# Patient Record
Sex: Male | Born: 1960 | Race: White | Hispanic: No | Marital: Married | Smoking: Former smoker
Health system: Southern US, Community
[De-identification: ages and names within clinical notes are randomized; demographics above are authoritative.]

## PROBLEM LIST (undated history)

## (undated) DIAGNOSIS — F32A Depression, unspecified: Secondary | ICD-10-CM

## (undated) DIAGNOSIS — M549 Dorsalgia, unspecified: Secondary | ICD-10-CM

## (undated) DIAGNOSIS — I10 Essential (primary) hypertension: Secondary | ICD-10-CM

## (undated) DIAGNOSIS — M5136 Other intervertebral disc degeneration, lumbar region: Secondary | ICD-10-CM

## (undated) DIAGNOSIS — F329 Major depressive disorder, single episode, unspecified: Secondary | ICD-10-CM

## (undated) DIAGNOSIS — E785 Hyperlipidemia, unspecified: Secondary | ICD-10-CM

## (undated) DIAGNOSIS — F209 Schizophrenia, unspecified: Secondary | ICD-10-CM

## (undated) DIAGNOSIS — G4730 Sleep apnea, unspecified: Secondary | ICD-10-CM

## (undated) DIAGNOSIS — N200 Calculus of kidney: Secondary | ICD-10-CM

## (undated) DIAGNOSIS — G8929 Other chronic pain: Secondary | ICD-10-CM

## (undated) DIAGNOSIS — M51369 Other intervertebral disc degeneration, lumbar region without mention of lumbar back pain or lower extremity pain: Secondary | ICD-10-CM

## (undated) HISTORY — PX: COLONOSCOPY: SHX174

---

## 2003-07-27 HISTORY — PX: CARDIAC CATHETERIZATION: SHX172

## 2004-04-09 ENCOUNTER — Inpatient Hospital Stay (HOSPITAL_BASED_OUTPATIENT_CLINIC_OR_DEPARTMENT_OTHER): Admission: RE | Admit: 2004-04-09 | Discharge: 2004-04-09 | Payer: Self-pay | Admitting: Cardiology

## 2007-10-17 ENCOUNTER — Ambulatory Visit: Payer: Self-pay | Admitting: Cardiology

## 2007-11-27 ENCOUNTER — Ambulatory Visit: Payer: Self-pay | Admitting: Cardiology

## 2008-01-24 ENCOUNTER — Ambulatory Visit: Payer: Self-pay | Admitting: Cardiology

## 2008-03-12 ENCOUNTER — Ambulatory Visit: Payer: Self-pay | Admitting: Cardiology

## 2009-04-09 ENCOUNTER — Ambulatory Visit: Payer: Self-pay | Admitting: Cardiology

## 2009-04-22 DIAGNOSIS — E782 Mixed hyperlipidemia: Secondary | ICD-10-CM | POA: Insufficient documentation

## 2009-04-22 DIAGNOSIS — E785 Hyperlipidemia, unspecified: Secondary | ICD-10-CM

## 2009-04-22 DIAGNOSIS — R079 Chest pain, unspecified: Secondary | ICD-10-CM | POA: Insufficient documentation

## 2009-04-22 DIAGNOSIS — E663 Overweight: Secondary | ICD-10-CM | POA: Insufficient documentation

## 2010-12-08 NOTE — Assessment & Plan Note (Signed)
[facility]                          [facility]   NAME:Williams Williams HELLMER                         MRN:          [id number]  DATE:03/12/2008                            DOB:          03/01/1961    Williams Williams is seen for cardiology followup.  He is known to our group  having had a chest pain in the past with normal coronaries in 2005.  He  was hospitalized in July 2009.  At that time, he had a stress echo.  His  ejection fraction was 50 to 55%.  There was no chest pain.  EKG changes  were equivocal.  There was no evidence of stress-induced left  ventricular dysfunction.  It was noted that the patient has a history of  severe right ventricular dysfunction.  Recommendation has been to  proceed with a cardiac MRI in the past.  I discussed this with him  today, and he would like to proceed.  We will look into making the  arrangements.   Recently, he has not had chest pain.  He has had some discomfort in his  legs intermittently.   ALLERGIES:  Rash from CODEINE.   MEDICATIONS:  1. Depakote 500.  2. Clonazepam 0.5.  3. Simvastatin 20.  4. Amitriptyline 25.  5. Lisinopril 10.  6. Atenolol 25.  7. Metformin 500 in the morning and 250 in the evening.  8. Lexapro 10.  9. Fish oil.   OTHER MEDICAL PROBLEMS:  See the list below.   REVIEW OF SYSTEMS:  Other than the HPI, his review of systems today is  negative.   PHYSICAL EXAMINATION:  VITAL SIGNS:  Weight is 320 pounds.  Blood  pressure is 124/85 with pulse of 80.  GENERAL:  The patient is oriented to person, time, and place.  Affect is  normal.  HEENT:  Reveals no xanthelasma.  He has normal extraocular motion.  NECK:  There are no carotid bruits.  There is no jugular venous  distention.  LUNGS:  Clear.  Respiratory effort is nonlabored.  CARDIAC:  Reveals an S1 and S2.  There are no clicks or significant  murmurs.  ABDOMEN:  Obese but soft.  EXTREMITIES:  He has no peripheral  edema.   PROBLEMS:  1. History of chest discomfort, stable recently.  Normal coronary      arteries by cath in 2005.  2. Hyperlipidemia.  3. Family history of coronary artery disease.  4. Depression.  5. Obesity.  6. Probable obstructive sleep apnea.  7. History of severe right ventricular dysfunction by echo with a need      for MRI.  8. History of polysubstance abuse.  9. History of schizophrenia.  10.History of depression.   Williams Williams cardiac status is stable today.  We will proceed with a  cardiac MRI to look carefully at the reason for his severe right  ventricular dysfunction.  This had been recommended to him in the past.  We will make sure Dr. Eden Emms is aware.   ADDENDUM:  We had been planning to try to proceed with a  cardiac MRI to  assess the patient's right ventricular dysfunction.  He weighs 320  pounds.  Unfortunately, the maximum for the magnet is 300 pounds.  We  will be in touch with him and encouraged him to begin to try to lose  some weight and then we will see him for cardiology followup to reassess  the situation.     Xavier Abed, MD, [facility]  Electronically Signed    JDK/MedQ  DD: 03/12/2008  DT: 03/13/2008  Job #: [id number]   cc:   Ernestine Conrad, MD

## 2010-12-08 NOTE — Assessment & Plan Note (Signed)
[facility]                          [facility]   NAME:Xavier Williams, Xavier Williams                         MRN:          [id number]  DATE:03/12/2008                            DOB:          Aug 23, 1960    ADDENDUM   We had been planning to try to proceed with a cardiac MRI to assess the  patient's right ventricular dysfunction.  He weighs 320 pounds.  Unfortunately, the maximum for the pregnant is 300 pounds.  We will be  in touch with him and encouraged him to begin to try to lose some weight  and then we will see him for cardiology followup to reassess the  situation.     Luis Abed, MD, [facility]     JDK/MedQ  DD: 03/12/2008  DT: 03/13/2008  Job #: [id number]

## 2010-12-11 NOTE — Cardiovascular Report (Signed)
NAME:  Xavier Williams, Xavier Williams                            ACCOUNT NO.:  [id number]   MEDICAL RECORD NO.:  [id number]                   PATIENT TYPE:  OIB   LOCATION:  6501                                 FACILITY:  [facility]   PHYSICIAN:  Rollene Rotunda, M.D.                DATE OF BIRTH:  May 03, 1961   DATE OF PROCEDURE:  04/09/2004  DATE OF DISCHARGE:                              CARDIAC CATHETERIZATION   PRIMARY CARE PHYSICIAN:  Dr. Lia Hopping.   PROCEDURE:  Left heart catheterization, coronary arteriography.   INDICATION:  Evaluate patient with chest pain in a submaximal exercise  treadmill test.   PROCEDURE NOTE:  Left heart catheterization was performed via the right  femoral artery.  The artery was cannulated using an anterior wall puncture.  A 4 French arterial sheath was inserted via the modified Seldinger  technique.  Preformed Judkins and a pigtail catheter were utilized.  The  patient tolerated the procedure well and left the lab in stable condition.   RESULTS:   HEMODYNAMICS:  1.  LV 142/18.  2.  Aortic 140/80.   CORONARIES:  The left main was normal.  The LAD was normal.  The small  diagonal was normal.  Circumflex was large and dominant and was normal in  the AV groove.  An OM-1 was moderate to large, nonbranching and normal.  An  OM-2 was very large and branching.  There was a first posterior lateral  which was large to moderate size and normal.  A second posterior lateral was  moderate size and normal.  The right coronary artery was nondominant and  normal.   LEFT VENTRICULOGRAM:  Left ventriculogram was obtained in the RAO  projection.  The EF was 55% with no regional wall motion abnormalities.   CONCLUSION:  1.  Normal coronaries.  2.  Low normal left ventricular ejection fraction with no regional wall      motion abnormalities.   PLAN:  No further cardiac workup is planned.  The patient will continue with  evaluation of probable nonanginal chest discomfort.                                               Rollene Rotunda, M.D.    JH/MEDQ  D:  04/09/2004  T:  04/09/2004  Job:  [id number]   cc:   Annette Stable Hasanaj  [address]  [place]  [place] [address]  Fax: [phone]   [facility] [place]

## 2011-02-19 ENCOUNTER — Emergency Department (HOSPITAL_COMMUNITY)
Admission: EM | Admit: 2011-02-19 | Discharge: 2011-02-20 | Disposition: A | Discharge status: Home / Self Care | Payer: Medicare HMO | Attending: Emergency Medicine | Admitting: Emergency Medicine

## 2011-02-19 ENCOUNTER — Other Ambulatory Visit: Payer: Self-pay

## 2011-02-19 ENCOUNTER — Encounter: Payer: Self-pay | Admitting: *Deleted

## 2011-02-19 DIAGNOSIS — R079 Chest pain, unspecified: Secondary | ICD-10-CM | POA: Insufficient documentation

## 2011-02-19 DIAGNOSIS — E119 Type 2 diabetes mellitus without complications: Secondary | ICD-10-CM | POA: Insufficient documentation

## 2011-02-19 DIAGNOSIS — Z87891 Personal history of nicotine dependence: Secondary | ICD-10-CM | POA: Insufficient documentation

## 2011-02-19 DIAGNOSIS — I10 Essential (primary) hypertension: Secondary | ICD-10-CM | POA: Insufficient documentation

## 2011-02-19 HISTORY — DX: Sleep apnea, unspecified: G47.30

## 2011-02-19 HISTORY — DX: Essential (primary) hypertension: I10

## 2011-02-19 NOTE — ED Provider Notes (Signed)
History     Chief Complaint  Patient presents with  . Chest Pain   HPI Comments: Patient was sitting on his porch at 830-900 last night and had a sharp stabbing pain to the left chest. It lasted only seconds. He got up and checked his sugar which was 94. Drank some sweet tea. Pain resolved and has not recurred. Son encouraged him to come to the ER and be checked out.Denies fever, chills, cough, shortness of breath.  Patient is a 50 y.o. male presenting with chest pain. The history is provided by the patient.  Chest Pain The chest pain began 3 - 5 hours ago. The chest pain is resolved. At its most intense, the pain is at 6/10. The pain is currently at 0/10. The severity of the pain is moderate. The quality of the pain is described as sharp and stabbing. Exacerbated by: nothing. Pertinent negatives for primary symptoms include no fever, no cough, no wheezing, no palpitations, no nausea, no vomiting and no dizziness.  Procedure history is positive for cardiac catheterization and echocardiogram.     Past Medical History  Diagnosis Date  . Diabetes mellitus   . Hypertension   . Sleep apnea     Past Surgical History  Procedure Date  . Cardiac catheterization   . Colonoscopy     History reviewed. No pertinent family history.  History  Substance Use Topics  . Smoking status: Former Games developer  . Smokeless tobacco: Not on file  . Alcohol Use: No      Review of Systems  Constitutional: Negative for fever.  Respiratory: Negative for cough and wheezing.   Cardiovascular: Positive for chest pain. Negative for palpitations.  Gastrointestinal: Negative for nausea and vomiting.  Neurological: Negative for dizziness.  All other systems reviewed and are negative.    Physical Exam  BP 137/91  Pulse 65  Temp(Src) 98.4 F (36.9 C) (Oral)  Ht 6' (1.829 m)  Wt 330 lb (149.687 kg)  BMI 44.76 kg/m2  SpO2 97%  Physical Exam  Nursing note and vitals reviewed. Constitutional: He is  oriented to person, place, and time. He appears well-developed and well-nourished.  HENT:  Head: Normocephalic.  Right Ear: External ear normal.  Left Ear: External ear normal.  Nose: Nose normal.  Mouth/Throat: Oropharynx is clear and moist.  Eyes: EOM are normal. Pupils are equal, round, and reactive to light.  Neck: Normal range of motion. Neck supple.  Cardiovascular: Normal rate, normal heart sounds and intact distal pulses.   Pulmonary/Chest: Effort normal and breath sounds normal.  Abdominal: Soft. Bowel sounds are normal.  Musculoskeletal: Normal range of motion.  Neurological: He is alert and oriented to person, place, and time.  Skin: Skin is warm and dry.    ED Course  Procedures  MDM Reviewed labs, radiology reports, nurse notes, vital signs. Reviewed results with patient. He has been painfree since arrival in the ER      [facility]. Colon Branch, MD 02/20/11 1610

## 2011-02-19 NOTE — ED Notes (Signed)
Pt c/o sharp chest pain to left side of chest with radiating pain to left arm and down left leg; pt states the pain started today and has had high blood pressure with it today

## 2011-02-20 ENCOUNTER — Emergency Department (HOSPITAL_COMMUNITY): Payer: Medicare HMO

## 2011-02-20 LAB — POCT I-STAT, CHEM 8
Creatinine, Ser: 1.1 mg/dL (ref 0.50–1.35)
HCT: 41 % (ref 39.0–52.0)
Hemoglobin: 13.9 g/dL (ref 13.0–17.0)
Potassium: 3.4 mEq/L — ABNORMAL LOW (ref 3.5–5.1)
Sodium: 139 mEq/L (ref 135–145)
TCO2: 20 mmol/L (ref 0–100)

## 2011-02-20 LAB — CARDIAC PANEL(CRET KIN+CKTOT+MB+TROPI)
CK, MB: 2.4 ng/mL (ref 0.3–4.0)
Relative Index: 2.1 (ref 0.0–2.5)
Total CK: 117 U/L (ref 7–232)

## 2011-02-20 NOTE — ED Notes (Signed)
Pt reports one brief sharp pain in chest earlier in the day.  Reports now he has some ache and tightness in left arm, and left leg.  Grasp equal bilat.  Good strength.  Pt does report history of  heart failure, and cardiac stents. Denies any current chest pain or SOB.  Very mild edema noted in lower extremities.  Pt reports compliance with medications.

## 2013-02-09 DIAGNOSIS — R079 Chest pain, unspecified: Secondary | ICD-10-CM

## 2013-03-11 ENCOUNTER — Emergency Department (HOSPITAL_COMMUNITY): Payer: Medicare Other

## 2013-03-11 ENCOUNTER — Emergency Department (HOSPITAL_COMMUNITY)
Admission: EM | Admit: 2013-03-11 | Discharge: 2013-03-11 | Disposition: A | Discharge status: Home / Self Care | Payer: Medicare Other | Attending: Emergency Medicine | Admitting: Emergency Medicine

## 2013-03-11 ENCOUNTER — Encounter (HOSPITAL_COMMUNITY): Payer: Self-pay | Admitting: Emergency Medicine

## 2013-03-11 DIAGNOSIS — W2209XA Striking against other stationary object, initial encounter: Secondary | ICD-10-CM | POA: Insufficient documentation

## 2013-03-11 DIAGNOSIS — F3289 Other specified depressive episodes: Secondary | ICD-10-CM | POA: Insufficient documentation

## 2013-03-11 DIAGNOSIS — Z8659 Personal history of other mental and behavioral disorders: Secondary | ICD-10-CM | POA: Insufficient documentation

## 2013-03-11 DIAGNOSIS — Z8739 Personal history of other diseases of the musculoskeletal system and connective tissue: Secondary | ICD-10-CM | POA: Insufficient documentation

## 2013-03-11 DIAGNOSIS — R319 Hematuria, unspecified: Secondary | ICD-10-CM | POA: Insufficient documentation

## 2013-03-11 DIAGNOSIS — S0001XA Abrasion of scalp, initial encounter: Secondary | ICD-10-CM

## 2013-03-11 DIAGNOSIS — M545 Low back pain, unspecified: Secondary | ICD-10-CM | POA: Insufficient documentation

## 2013-03-11 DIAGNOSIS — F329 Major depressive disorder, single episode, unspecified: Secondary | ICD-10-CM | POA: Insufficient documentation

## 2013-03-11 DIAGNOSIS — Y929 Unspecified place or not applicable: Secondary | ICD-10-CM | POA: Insufficient documentation

## 2013-03-11 DIAGNOSIS — I10 Essential (primary) hypertension: Secondary | ICD-10-CM | POA: Insufficient documentation

## 2013-03-11 DIAGNOSIS — G8929 Other chronic pain: Secondary | ICD-10-CM

## 2013-03-11 DIAGNOSIS — E119 Type 2 diabetes mellitus without complications: Secondary | ICD-10-CM | POA: Insufficient documentation

## 2013-03-11 DIAGNOSIS — Z79899 Other long term (current) drug therapy: Secondary | ICD-10-CM | POA: Insufficient documentation

## 2013-03-11 DIAGNOSIS — Z87891 Personal history of nicotine dependence: Secondary | ICD-10-CM | POA: Insufficient documentation

## 2013-03-11 DIAGNOSIS — IMO0002 Reserved for concepts with insufficient information to code with codable children: Secondary | ICD-10-CM | POA: Insufficient documentation

## 2013-03-11 DIAGNOSIS — Y939 Activity, unspecified: Secondary | ICD-10-CM | POA: Insufficient documentation

## 2013-03-11 DIAGNOSIS — M549 Dorsalgia, unspecified: Secondary | ICD-10-CM

## 2013-03-11 DIAGNOSIS — Z23 Encounter for immunization: Secondary | ICD-10-CM | POA: Insufficient documentation

## 2013-03-11 DIAGNOSIS — Z7982 Long term (current) use of aspirin: Secondary | ICD-10-CM | POA: Insufficient documentation

## 2013-03-11 DIAGNOSIS — E785 Hyperlipidemia, unspecified: Secondary | ICD-10-CM | POA: Insufficient documentation

## 2013-03-11 HISTORY — DX: Hyperlipidemia, unspecified: E78.5

## 2013-03-11 HISTORY — DX: Other chronic pain: G89.29

## 2013-03-11 HISTORY — DX: Other intervertebral disc degeneration, lumbar region: M51.36

## 2013-03-11 HISTORY — DX: Schizophrenia, unspecified: F20.9

## 2013-03-11 HISTORY — DX: Dorsalgia, unspecified: M54.9

## 2013-03-11 HISTORY — DX: Depression, unspecified: F32.A

## 2013-03-11 HISTORY — DX: Major depressive disorder, single episode, unspecified: F32.9

## 2013-03-11 HISTORY — DX: Other intervertebral disc degeneration, lumbar region without mention of lumbar back pain or lower extremity pain: M51.369

## 2013-03-11 LAB — URINALYSIS, ROUTINE W REFLEX MICROSCOPIC
Leukocytes, UA: NEGATIVE
Nitrite: NEGATIVE
Protein, ur: NEGATIVE mg/dL
Specific Gravity, Urine: >1.03 — ABNORMAL HIGH (ref 1.005–1.030)
Urobilinogen, UA: 0.2 mg/dL (ref 0.0–1.0)

## 2013-03-11 LAB — URINE MICROSCOPIC-ADD ON

## 2013-03-11 MED ORDER — METHOCARBAMOL 500 MG PO TABS: 1000.0000 mg | ORAL_TABLET | Freq: Four times a day (QID) | ORAL | Status: DC | PRN

## 2013-03-11 MED ORDER — TETANUS-DIPHTH-ACELL PERTUSSIS 5-2.5-18.5 LF-MCG/0.5 IM SUSP
0.5000 mL | Freq: Once | INTRAMUSCULAR | Status: AC
  Administered 2013-03-11: 0.5 mL via INTRAMUSCULAR
  Filled 2013-03-11: qty 0.5

## 2013-03-11 MED ORDER — OXYCODONE-ACETAMINOPHEN 5-325 MG PO TABS
2.0000 | ORAL_TABLET | Freq: Once | ORAL | Status: AC
  Administered 2013-03-11: 2 via ORAL
  Filled 2013-03-11: qty 2

## 2013-03-11 NOTE — ED Provider Notes (Signed)
CSN: [id number]     Arrival date & time 03/11/13  1704 History     First MD Initiated Contact with Patient 03/11/13 1723     Chief Complaint  Patient presents with  . Hematuria  . Back Pain   HPI Pt was seen at 1730. Per pt, c/o gradual onset and persistence of constant acute flair of his chronic left sided low back "pain" for the past 1 year, worse over the past several days.  Denies any change in his usual chronic pain pattern.  Pain worsens with palpation of the area and body position changes. States has had "dark urine" today, and did not know if that was related to his back pain.  Denies dysuria, no testicular pain/swelling, no flank pain. Denies incont/retention of bowel or bladder, no saddle anesthesia, no focal motor weakness, no tingling/numbness in extremities, no fevers, no low back injury, no abd pain, no CP/SOB. Pt also c/o sudden onset and resolution of one episode of scalp injury that occurred today. States he hit the top of his head on a car today and "cut myself." Denies LOC, no AMS, no neck pain, no vusial changes.        Past Medical History  Diagnosis Date  . Diabetes mellitus   . Hypertension   . Sleep apnea   . DDD (degenerative disc disease), lumbar   . Depression   . Schizophrenia   . Hyperlipidemia   . Chronic back pain    Past Surgical History  Procedure Laterality Date  . Colonoscopy    . Cardiac catheterization  2005    normal coronary arteries    History  Substance Use Topics  . Smoking status: Former Games developer  . Smokeless tobacco: Not on file  . Alcohol Use: No    Review of Systems ROS: Statement: All systems negative except as marked or noted in the HPI; Constitutional: Negative for fever and chills. ; ; Eyes: Negative for eye pain, redness and discharge. ; ; ENMT: Negative for ear pain, hoarseness, nasal congestion, sinus pressure and sore throat. ; ; Cardiovascular: Negative for chest pain, palpitations, diaphoresis, dyspnea and peripheral  edema. ; ; Respiratory: Negative for cough, wheezing and stridor. ; ; Gastrointestinal: Negative for nausea, vomiting, diarrhea, abdominal pain, blood in stool, hematemesis, jaundice and rectal bleeding. . ; ; Genitourinary: +"dark urine." Negative for dysuria, flank pain and hematuria. ; ; Genital:  No penile drainage or rash, no testicular pain or swelling, no scrotal rash or swelling.;; Musculoskeletal: +LBP. Negative for neck pain. Negative for swelling and trauma.; ; Skin: +abrasion. Negative for pruritus, rash, blisters, bruising and skin lesion.; ; Neuro: Negative for headache, lightheadedness and neck stiffness. Negative for weakness, altered level of consciousness , altered mental status, extremity weakness, paresthesias, involuntary movement, seizure and syncope.       Allergies  Codeine  Home Medications   Current Outpatient Rx  Name  Route  Sig  Dispense  Refill  . aspirin 81 MG tablet   Oral   Take 81 mg by mouth every morning.          Marland Kitchen aspirin-acetaminophen-caffeine (EXCEDRIN MIGRAINE) 250-250-65 MG per tablet   Oral   Take 2 tablets by mouth daily as needed for pain.         . Aspirin-Acetaminophen-Caffeine (GOODY HEADACHE PO)   Oral   Take 1 packet by mouth daily as needed (FOR PAIN).         Marland Kitchen atenolol (TENORMIN) 25 MG tablet   Oral  Take 25 mg by mouth daily.           Marland Kitchen buPROPion (WELLBUTRIN XL) 150 MG 24 hr tablet   Oral   Take 150 mg by mouth every morning.         . cloNIDine (CATAPRES) 0.1 MG tablet   Oral   Take 0.2 mg by mouth at bedtime.           . diazepam (VALIUM) 10 MG tablet   Oral   Take 10 mg by mouth 2 (two) times daily.         . fish oil-omega-3 fatty acids 1000 MG capsule   Oral   Take 1 g by mouth 2 (two) times daily.         . fluticasone (FLONASE) 50 MCG/ACT nasal spray   Nasal   Place 2 sprays into the nose as needed.           Marland Kitchen glimepiride (AMARYL) 2 MG tablet   Oral   Take 1 mg by mouth every morning.           . hydrochlorothiazide 25 MG tablet   Oral   Take 12.5 mg by mouth every morning.          Marland Kitchen lisinopril (PRINIVIL,ZESTRIL) 10 MG tablet   Oral   Take 5 mg by mouth every morning.         . metFORMIN (GLUCOPHAGE) 1000 MG tablet   Oral   Take 1,000 mg by mouth 2 (two) times daily with a meal.           . pantoprazole (PROTONIX) 40 MG tablet   Oral   Take 40 mg by mouth every morning.         . sertraline (ZOLOFT) 50 MG tablet   Oral   Take 50 mg by mouth every morning.         . simvastatin (ZOCOR) 20 MG tablet   Oral   Take 20 mg by mouth at bedtime.           . nitroGLYCERIN (NITROSTAT) 0.4 MG SL tablet   Sublingual   Place 0.4 mg under the tongue every 5 (five) minutes as needed.            BP 143/84  Pulse 72  Temp(Src) 98.4 F (36.9 C) (Oral)  Resp 20  Ht 6' (1.829 m)  Wt 295 lb (133.811 kg)  BMI 40 kg/m2  SpO2 98% Physical Exam 1735: Physical examination:  Nursing notes reviewed; Vital signs and O2 SAT reviewed;  Constitutional: Well developed, Well nourished, Well hydrated, In no acute distress; Head:  Normocephalic, +approx 1.5cm linear superficial hemostatic abrasion to scalp at top of head.; Eyes: EOMI, PERRL, No scleral icterus; ENMT: Mouth and pharynx normal, Mucous membranes moist; Neck: Supple, Full range of motion, No lymphadenopathy; Cardiovascular: Regular rate and rhythm, No gallop; Respiratory: Breath sounds clear & equal bilaterally, No rales, rhonchi, wheezes.  Speaking full sentences with ease, Normal respiratory effort/excursion; Chest: Nontender, Movement normal; Abdomen: Soft, Nontender, Nondistended, Normal bowel sounds; Genitourinary: No CVA tenderness; Spine:  No midline CS, TS, LS tenderness. +TTP left lumbar paraspinal muscles. No rash.;; Extremities: Pulses normal, No tenderness, No edema, No calf edema or asymmetry.; Neuro: AA&Ox3, Major CN grossly intact.  Speech clear. Strength 5/5 equal bilat UE's and LE's, including  great toe dorsiflexion.  DTR 2/4 equal bilat UE's and LE's.  No gross sensory deficits.  Neg straight leg raises bilat. Climbs on and off stretcher  easily by himself. Gait steady.;; Skin: Color normal, Warm, Dry.   ED Course   Procedures   MDM  MDM Reviewed: previous chart, nursing note and vitals Interpretation: CT scan and labs   Results for orders placed during the hospital encounter of 03/11/13  URINALYSIS, ROUTINE W REFLEX MICROSCOPIC      Result Value Range   Color, Urine YELLOW  YELLOW   APPearance CLEAR  CLEAR   Specific Gravity, Urine >1.030 (*) 1.005 - 1.030   pH 5.5  5.0 - 8.0   Glucose, UA NEGATIVE  NEGATIVE mg/dL   Hgb urine dipstick TRACE (*) NEGATIVE   Bilirubin Urine NEGATIVE  NEGATIVE   Ketones, ur NEGATIVE  NEGATIVE mg/dL   Protein, ur NEGATIVE  NEGATIVE mg/dL   Urobilinogen, UA 0.2  0.0 - 1.0 mg/dL   Nitrite NEGATIVE  NEGATIVE   Leukocytes, UA NEGATIVE  NEGATIVE  URINE MICROSCOPIC-ADD ON      Result Value Range   RBC / HPF 3-6  <3 RBC/hpf   Ct Abdomen Pelvis Wo Contrast 03/11/2013   *RADIOLOGY REPORT*  Clinical Data: Hematuria and back pain.  CT ABDOMEN AND PELVIS WITHOUT CONTRAST  Technique:  Multidetector CT imaging of the abdomen and pelvis was performed following the standard protocol without intravenous contrast.  Comparison: None.  Findings: Lung Bases: There is a tiny calcified granuloma in the anterior left lower lobe.  Liver:  Unenhanced CT was performed per clinician order.  Lack of IV contrast limits sensitivity and specificity, especially for evaluation of abdominal/pelvic solid viscera.  Mild hepatosteatosis.  Spleen:  Normal.  Gallbladder:  Normal.  Common bile duct:  Normal.  Pancreas:  Normal.  Adrenal glands:  Normal bilaterally.  Kidneys:  Punctate nonobstructing left upper pole renal collecting system calculus.  Small hyperdense area in the right inferior renal pole is nonspecific but probably represents either milk of calcium in a cyst or a small  stone in a calyceal diverticulum.  The both ureters appear within normal limits.  Stomach:  Normal.  Small bowel:  Normal.  No obstruction or mesenteric adenopathy.  No free air.  Colon:   Normal appendix.  Colon appears within normal limits.  Pelvic Genitourinary:  Normal prostate.  Partially decompressed urinary bladder.  No free fluid.  Bones:  No aggressive osseous lesions.  Lumbar spondylosis. Chronic appearing T12 compression fracture with superior endplate Schmorl's node.  Vasculature: Mild atherosclerosis.  Body Wall: Tiny fat containing periumbilical hernia.  IMPRESSION:  1.  No ureteral stones or hydronephrosis.  Punctate left upper pole renal collecting system calculus.  Left upper pole 14 mm renal cyst. 2.  Calcifications in the right inferior kidney probably represent milk of calcium in a cyst or stones within the calyceal diverticulum. 3.  Mild hepatosteatosis.   Original Report Authenticated By: Andreas Newport, M.D.    1930:  Pt has tol PO well while in the ED without N/V. Abd remains benign, VSS. Feels better after pain meds and wants to go home now.  Wound care provided to scalp abrasion. Td updated. No acute findings on CT scan. No UTI on Udip. Appears to be acute flair of his chronic pain.  Pt denies any change from his usual chronic pain pattern.  Pt encouraged to f/u with his PMD and Pain Management doctor for good continuity of care and control of his chronic pain.  Verb understanding.       Laray Anger, DO 03/14/13 1047

## 2013-03-11 NOTE — ED Notes (Signed)
Pt alert & oriented x4, stable gait. Patient given discharge instructions, paperwork & prescription(s). Patient  instructed to stop at the registration desk to finish any additional paperwork. Patient verbalized understanding. Pt left department w/ no further questions. 

## 2013-03-11 NOTE — ED Notes (Signed)
Pt c/o lower back pain with blood in urine. Pain mostly to left side. Pt hit top of head on car today. Small cut to top of head appro 1inch long. Barely broke skin. No bleeding at present. nad

## 2013-07-17 ENCOUNTER — Encounter (HOSPITAL_COMMUNITY): Payer: Self-pay | Admitting: Emergency Medicine

## 2013-07-17 ENCOUNTER — Emergency Department (HOSPITAL_COMMUNITY)
Admission: EM | Admit: 2013-07-17 | Discharge: 2013-07-17 | Disposition: A | Discharge status: Home / Self Care | Payer: Medicare Other | Attending: Emergency Medicine | Admitting: Emergency Medicine

## 2013-07-17 DIAGNOSIS — I10 Essential (primary) hypertension: Secondary | ICD-10-CM | POA: Insufficient documentation

## 2013-07-17 DIAGNOSIS — E785 Hyperlipidemia, unspecified: Secondary | ICD-10-CM | POA: Insufficient documentation

## 2013-07-17 DIAGNOSIS — G8929 Other chronic pain: Secondary | ICD-10-CM | POA: Insufficient documentation

## 2013-07-17 DIAGNOSIS — F209 Schizophrenia, unspecified: Secondary | ICD-10-CM | POA: Insufficient documentation

## 2013-07-17 DIAGNOSIS — E119 Type 2 diabetes mellitus without complications: Secondary | ICD-10-CM | POA: Insufficient documentation

## 2013-07-17 DIAGNOSIS — F329 Major depressive disorder, single episode, unspecified: Secondary | ICD-10-CM | POA: Insufficient documentation

## 2013-07-17 DIAGNOSIS — R200 Anesthesia of skin: Secondary | ICD-10-CM

## 2013-07-17 DIAGNOSIS — F3289 Other specified depressive episodes: Secondary | ICD-10-CM | POA: Insufficient documentation

## 2013-07-17 DIAGNOSIS — Z79899 Other long term (current) drug therapy: Secondary | ICD-10-CM | POA: Insufficient documentation

## 2013-07-17 DIAGNOSIS — Z7982 Long term (current) use of aspirin: Secondary | ICD-10-CM | POA: Insufficient documentation

## 2013-07-17 DIAGNOSIS — E669 Obesity, unspecified: Secondary | ICD-10-CM | POA: Insufficient documentation

## 2013-07-17 DIAGNOSIS — R209 Unspecified disturbances of skin sensation: Secondary | ICD-10-CM | POA: Insufficient documentation

## 2013-07-17 DIAGNOSIS — Z95818 Presence of other cardiac implants and grafts: Secondary | ICD-10-CM | POA: Insufficient documentation

## 2013-07-17 DIAGNOSIS — Z87891 Personal history of nicotine dependence: Secondary | ICD-10-CM | POA: Insufficient documentation

## 2013-07-17 MED ORDER — PREDNISONE 10 MG PO TABS: 20.0000 mg | ORAL_TABLET | Freq: Two times a day (BID) | ORAL | Status: DC

## 2013-07-17 NOTE — ED Notes (Signed)
Patient complaining of numbness to left hand x 3 weeks. Reports "my pinky and ring finger draw up at times."

## 2013-07-17 NOTE — ED Provider Notes (Signed)
CSN: [id number]     Arrival date & time 07/17/13  0215 History   First MD Initiated Contact with Patient 07/17/13 0228     Chief Complaint  Patient presents with  . Hand Problem   (Consider location/radiation/quality/duration/timing/severity/associated sxs/prior Treatment) HPI Comments: Patient is a 52 year old male with history of obesity, hypertension, diabetes. He presents today with complaints of numbness to the pinky and ring finger which has been worsening for the past 3 weeks. States that there has been no injury or trauma, but does state that he spends a good deal of time on the computer leaning on his elbow. He can feel the pain radiate up to the elbow but denies any shoulder or neck discomfort. He denies any weakness and is able to grasp objects without difficulty.  Patient is a 52 y.o. male presenting with hand pain. The history is provided by the patient.  Hand Pain This is a new problem. Episode onset: 3 weeks. The problem occurs constantly. The problem has been gradually worsening. Nothing aggravates the symptoms. Nothing relieves the symptoms. He has tried nothing for the symptoms. The treatment provided no relief.    Past Medical History  Diagnosis Date  . Diabetes mellitus   . Hypertension   . Sleep apnea   . DDD (degenerative disc disease), lumbar   . Depression   . Schizophrenia   . Hyperlipidemia   . Chronic back pain    Past Surgical History  Procedure Laterality Date  . Colonoscopy    . Cardiac catheterization  2005    normal coronary arteries   History reviewed. No pertinent family history. History  Substance Use Topics  . Smoking status: Former Games developer  . Smokeless tobacco: Not on file  . Alcohol Use: No    Review of Systems  All other systems reviewed and are negative.    Allergies  Codeine  Home Medications   Current Outpatient Rx  Name  Route  Sig  Dispense  Refill  . aspirin 81 MG tablet   Oral   Take 81 mg by mouth every morning.           Marland Kitchen aspirin-acetaminophen-caffeine (EXCEDRIN MIGRAINE) 250-250-65 MG per tablet   Oral   Take 2 tablets by mouth daily as needed for pain.         . Aspirin-Acetaminophen-Caffeine (GOODY HEADACHE PO)   Oral   Take 1 packet by mouth daily as needed (FOR PAIN).         Marland Kitchen atenolol (TENORMIN) 25 MG tablet   Oral   Take 25 mg by mouth daily.           Marland Kitchen buPROPion (WELLBUTRIN XL) 150 MG 24 hr tablet   Oral   Take 150 mg by mouth every morning.         . cloNIDine (CATAPRES) 0.1 MG tablet   Oral   Take 0.2 mg by mouth at bedtime.           . diazepam (VALIUM) 10 MG tablet   Oral   Take 10 mg by mouth 2 (two) times daily.         . fish oil-omega-3 fatty acids 1000 MG capsule   Oral   Take 1 g by mouth 2 (two) times daily.         . fluticasone (FLONASE) 50 MCG/ACT nasal spray   Nasal   Place 2 sprays into the nose as needed.           Marland Kitchen glimepiride (  AMARYL) 2 MG tablet   Oral   Take 1 mg by mouth every morning.          . hydrochlorothiazide 25 MG tablet   Oral   Take 12.5 mg by mouth every morning.          Marland Kitchen lisinopril (PRINIVIL,ZESTRIL) 10 MG tablet   Oral   Take 5 mg by mouth every morning.         . metFORMIN (GLUCOPHAGE) 1000 MG tablet   Oral   Take 1,000 mg by mouth 2 (two) times daily with a meal.           . methocarbamol (ROBAXIN) 500 MG tablet   Oral   Take 2 tablets (1,000 mg total) by mouth 4 (four) times daily as needed (muscle spasm/pain).   25 tablet   0   . nitroGLYCERIN (NITROSTAT) 0.4 MG SL tablet   Sublingual   Place 0.4 mg under the tongue every 5 (five) minutes as needed.           . pantoprazole (PROTONIX) 40 MG tablet   Oral   Take 40 mg by mouth every morning.         . sertraline (ZOLOFT) 50 MG tablet   Oral   Take 50 mg by mouth every morning.         . simvastatin (ZOCOR) 20 MG tablet   Oral   Take 20 mg by mouth at bedtime.            BP 132/88  Pulse 80  Temp(Src) 98.4 F (36.9 C)  (Oral)  Resp 18  Ht 6' (1.829 m)  Wt 295 lb (133.811 kg)  BMI 40.00 kg/m2  SpO2 97% Physical Exam  Nursing note and vitals reviewed. Constitutional: He is oriented to person, place, and time. He appears well-developed and well-nourished. No distress.  HENT:  Head: Normocephalic and atraumatic.  Mouth/Throat: Oropharynx is clear and moist.  Neck: Normal range of motion. Neck supple.  Musculoskeletal: Normal range of motion.  The left elbow appears grossly normal as does the left hand. He is able to oppose and abduct and adduct all fingers without difficulty. He can extend the wrist. Sensation is intact to the dorsal aspect of both sides of the hand.  Neurological: He is alert and oriented to person, place, and time.  Skin: Skin is warm and dry. He is not diaphoretic.    ED Course  Procedures (including critical care time) Labs Review Labs Reviewed - No data to display Imaging Review No results found.    MDM  No diagnosis found. This appears to be some sort of ulnar nerve compression, likely at the level of the elbow. I will treat with prednisone and rest. If this does not improve his symptoms in the next week I advised him to followup with his primary Dr. to discuss referral to an orthopedic surgeon. He is to return if he develops worsening symptoms or if his symptoms change.    Geoffery Lyons, MD 07/17/13 [phone]

## 2014-12-27 ENCOUNTER — Encounter (HOSPITAL_COMMUNITY): Payer: Self-pay | Admitting: *Deleted

## 2014-12-27 ENCOUNTER — Other Ambulatory Visit: Payer: Self-pay

## 2014-12-27 ENCOUNTER — Emergency Department (HOSPITAL_COMMUNITY)
Admission: EM | Admit: 2014-12-27 | Discharge: 2014-12-27 | Disposition: A | Discharge status: Home / Self Care | Payer: Medicare Other | Attending: Emergency Medicine | Admitting: Emergency Medicine

## 2014-12-27 DIAGNOSIS — I10 Essential (primary) hypertension: Secondary | ICD-10-CM | POA: Insufficient documentation

## 2014-12-27 DIAGNOSIS — R0789 Other chest pain: Secondary | ICD-10-CM | POA: Diagnosis not present

## 2014-12-27 DIAGNOSIS — Y998 Other external cause status: Secondary | ICD-10-CM | POA: Insufficient documentation

## 2014-12-27 DIAGNOSIS — T675XXA Heat exhaustion, unspecified, initial encounter: Secondary | ICD-10-CM | POA: Diagnosis not present

## 2014-12-27 DIAGNOSIS — Z8639 Personal history of other endocrine, nutritional and metabolic disease: Secondary | ICD-10-CM | POA: Insufficient documentation

## 2014-12-27 DIAGNOSIS — F329 Major depressive disorder, single episode, unspecified: Secondary | ICD-10-CM | POA: Diagnosis not present

## 2014-12-27 DIAGNOSIS — Y9301 Activity, walking, marching and hiking: Secondary | ICD-10-CM | POA: Insufficient documentation

## 2014-12-27 DIAGNOSIS — X58XXXA Exposure to other specified factors, initial encounter: Secondary | ICD-10-CM | POA: Insufficient documentation

## 2014-12-27 DIAGNOSIS — Z7952 Long term (current) use of systemic steroids: Secondary | ICD-10-CM | POA: Diagnosis not present

## 2014-12-27 DIAGNOSIS — E119 Type 2 diabetes mellitus without complications: Secondary | ICD-10-CM | POA: Insufficient documentation

## 2014-12-27 DIAGNOSIS — R079 Chest pain, unspecified: Secondary | ICD-10-CM | POA: Diagnosis present

## 2014-12-27 DIAGNOSIS — E876 Hypokalemia: Secondary | ICD-10-CM | POA: Diagnosis not present

## 2014-12-27 DIAGNOSIS — G8929 Other chronic pain: Secondary | ICD-10-CM | POA: Diagnosis not present

## 2014-12-27 DIAGNOSIS — Z79899 Other long term (current) drug therapy: Secondary | ICD-10-CM | POA: Diagnosis not present

## 2014-12-27 DIAGNOSIS — Z87891 Personal history of nicotine dependence: Secondary | ICD-10-CM | POA: Insufficient documentation

## 2014-12-27 DIAGNOSIS — Z8739 Personal history of other diseases of the musculoskeletal system and connective tissue: Secondary | ICD-10-CM | POA: Diagnosis not present

## 2014-12-27 DIAGNOSIS — Z791 Long term (current) use of non-steroidal anti-inflammatories (NSAID): Secondary | ICD-10-CM | POA: Diagnosis not present

## 2014-12-27 DIAGNOSIS — F419 Anxiety disorder, unspecified: Secondary | ICD-10-CM | POA: Diagnosis not present

## 2014-12-27 DIAGNOSIS — F209 Schizophrenia, unspecified: Secondary | ICD-10-CM | POA: Insufficient documentation

## 2014-12-27 DIAGNOSIS — Z9889 Other specified postprocedural states: Secondary | ICD-10-CM | POA: Diagnosis not present

## 2014-12-27 DIAGNOSIS — Y9289 Other specified places as the place of occurrence of the external cause: Secondary | ICD-10-CM | POA: Insufficient documentation

## 2014-12-27 LAB — LIPASE, BLOOD: LIPASE: 29 U/L (ref 22–51)

## 2014-12-27 LAB — CBC WITH DIFFERENTIAL/PLATELET
BASOS PCT: 0 % (ref 0–1)
Basophils Absolute: 0 10*3/uL (ref 0.0–0.1)
EOS PCT: 0 % (ref 0–5)
Eosinophils Absolute: 0 10*3/uL (ref 0.0–0.7)
HCT: 37 % — ABNORMAL LOW (ref 39.0–52.0)
Hemoglobin: 12 g/dL — ABNORMAL LOW (ref 13.0–17.0)
Lymphocytes Relative: 34 % (ref 12–46)
Lymphs Abs: 3 10*3/uL (ref 0.7–4.0)
MCH: 24.7 pg — ABNORMAL LOW (ref 26.0–34.0)
MCHC: 32.4 g/dL (ref 30.0–36.0)
MCV: 76.1 fL — ABNORMAL LOW (ref 78.0–100.0)
Monocytes Absolute: 0.7 10*3/uL (ref 0.1–1.0)
Monocytes Relative: 8 % (ref 3–12)
Neutro Abs: 5.1 10*3/uL (ref 1.7–7.7)
Neutrophils Relative %: 58 % (ref 43–77)
PLATELETS: 290 10*3/uL (ref 150–400)
RBC: 4.86 MIL/uL (ref 4.22–5.81)
RDW: 14.5 % (ref 11.5–15.5)
WBC: 9 10*3/uL (ref 4.0–10.5)

## 2014-12-27 LAB — COMPREHENSIVE METABOLIC PANEL
ALBUMIN: 4.4 g/dL (ref 3.5–5.0)
ALT: 30 U/L (ref 17–63)
AST: 41 U/L (ref 15–41)
Alkaline Phosphatase: 51 U/L (ref 38–126)
Anion gap: 10 (ref 5–15)
BILIRUBIN TOTAL: 0.8 mg/dL (ref 0.3–1.2)
BUN: 36 mg/dL — ABNORMAL HIGH (ref 6–20)
CHLORIDE: 97 mmol/L — AB (ref 101–111)
CO2: 25 mmol/L (ref 22–32)
CREATININE: 1.58 mg/dL — AB (ref 0.61–1.24)
Calcium: 9.1 mg/dL (ref 8.9–10.3)
GFR calc Af Amer: 56 mL/min — ABNORMAL LOW (ref >60)
GFR, EST NON AFRICAN AMERICAN: 48 mL/min — AB (ref >60)
GLUCOSE: 148 mg/dL — AB (ref 65–99)
Potassium: 3.2 mmol/L — ABNORMAL LOW (ref 3.5–5.1)
Sodium: 132 mmol/L — ABNORMAL LOW (ref 135–145)
Total Protein: 8.3 g/dL — ABNORMAL HIGH (ref 6.5–8.1)

## 2014-12-27 LAB — CK: Total CK: 127 U/L (ref 49–397)

## 2014-12-27 LAB — TROPONIN I

## 2014-12-27 MED ORDER — NITROGLYCERIN 2 % TD OINT
1.0000 [in_us] | TOPICAL_OINTMENT | Freq: Once | TRANSDERMAL | Status: DC
Start: 2014-12-27 — End: 2014-12-27
  Filled 2014-12-27: qty 1

## 2014-12-27 MED ORDER — ASPIRIN 81 MG PO CHEW
324.0000 mg | CHEWABLE_TABLET | Freq: Once | ORAL | Status: AC
  Administered 2014-12-27: 324 mg via ORAL
  Filled 2014-12-27: qty 4

## 2014-12-27 MED ORDER — POTASSIUM CHLORIDE CRYS ER 20 MEQ PO TBCR: 20.0000 meq | EXTENDED_RELEASE_TABLET | Freq: Two times a day (BID) | ORAL | Status: DC

## 2014-12-27 MED ORDER — POTASSIUM CHLORIDE CRYS ER 20 MEQ PO TBCR
40.0000 meq | EXTENDED_RELEASE_TABLET | Freq: Once | ORAL | Status: AC
  Administered 2014-12-27: 40 meq via ORAL
  Filled 2014-12-27: qty 2

## 2014-12-27 MED ORDER — SODIUM CHLORIDE 0.9 % IV BOLUS (SEPSIS)
1000.0000 mL | Freq: Once | INTRAVENOUS | Status: AC
  Administered 2014-12-27: 1000 mL via INTRAVENOUS

## 2014-12-27 MED ORDER — SODIUM CHLORIDE 0.9 % IV BOLUS (SEPSIS)
1000.0000 mL | Freq: Once | INTRAVENOUS | Status: AC
Start: 2014-12-27 — End: 2014-12-27
  Administered 2014-12-27: 1000 mL via INTRAVENOUS

## 2014-12-27 NOTE — ED Notes (Signed)
IV d/ced at this time d/t discharge from left forearm and it was a 20G.

## 2014-12-27 NOTE — ED Notes (Signed)
Pt given ice water.

## 2014-12-27 NOTE — Discharge Instructions (Signed)
Drink plenty of fluids, especially during the summer when it is hot. Drink sports drinks to replace the salt you lose with your sweat. Take the potassium pills until gone.   Recheck if you feel worse again.   Be careful in the heat, you will be more sensitive to the heat now.  Heat-Related Illness Heat-related illnesses occur when the body is unable to properly cool itself. The body normally cools itself by sweating. However, under some conditions sweating is not enough. In these cases, a person's body temperature rises rapidly. Very high body temperatures may damage the brain or other vital organs. Some examples of heat-related illnesses include:  Heat stroke. This occurs when the body is unable to regulate its temperature. The body's temperature rises rapidly, the sweating mechanism fails, and the body is unable to cool down. Body temperature may rise to 106 F (41 C) or higher within 10 to 15 minutes. Heat stroke can cause death or permanent disability if emergency treatment is not provided.  Heat exhaustion. This is a milder form of heat-related illness that can develop after several days of exposure to high temperatures and not enough fluids. It is the body's response to an excessive loss of the water and salt contained in sweat.  Heat cramps. These usually affect people who sweat a lot during heavy activity. This sweating drains the body's salt and moisture. The low salt level in the muscles causes painful cramps. Heat cramps may also be a symptom of heat exhaustion. Heat cramps usually occur in the abdomen, arms, or legs. Get medical attention for cramps if you have heart problems or are on a low-sodium diet. Those that are at greatest risk for heat-related illnesses include:   The elderly.  Infant and the very young.  People with mental illness and chronic diseases.  People who are overweight (obese).  Young and healthy people can even succumb to heat if they participate in strenuous  physical activities during hot weather. CAUSES  Several factors affect the body's ability to cool itself during extremely hot weather. When the humidity is high, sweat will not evaporate as quickly. This prevents the body from releasing heat quickly. Other factors that can affect the body's ability to cool down include:   Age.  Obesity.  Fever.  Dehydration.  Heart disease.  Mental illness.  Poor circulation.  Sunburn.  Prescription drug use.  Alcohol use. SYMPTOMS  Heat stroke: Warning signs of heat stroke vary, but may include:  An extremely high body temperature (above 103F orally).  A fast, strong pulse.  Dizziness.  Confusion.  Red, hot, and dry skin.  No sweating.  Throbbing headache.  Feeling sick to your stomach (nauseous).  Unconsciousness. Heat exhaustion: Warning signs of heat exhaustion include:  Heavy sweating.  Tiredness.  Headache.  Paleness.  Weakness.  Feeling sick to your stomach (nauseous) or vomiting.  Muscle cramps. Heat cramps  Muscle pains or spasms. TREATMENT  Heat stroke  Get into a cool environment. An indoor place that is air-conditioned may be best.  Take a cool shower or bath. Have someone around to make sure you are okay.  Take your temperature. Make sure it is going down. Heat exhaustion  Drink plenty of fluids. Do not drink liquids that contain caffeine, alcohol, or large amounts of sugar. These cause you to lose more body fluid. Also, avoid very cold drinks. They can cause stomach cramps.  Get into a cool environment. An indoor place that is air-conditioned may be best.  Take a cool shower or bath. Have someone around to make sure you are okay.  Put on lightweight clothing. Heat cramps  Stop whatever activity you were doing. Do not attempt to do that activity for at least 3 hours after the cramps have gone away.  Get into a cool environment. An indoor place that is air-conditioned may be best. HOME  CARE INSTRUCTIONS  To protect your health when temperatures are extremely high, follow these tips:  During heavy exercise in a hot environment, drink two to four glasses (16-32 ounces) of cool fluids each hour. Do not wait until you are thirsty to drink. Warning: If your caregiver limits the amount of fluid you drink or has you on water pills, ask how much you should drink while the weather is hot.  Do not drink liquids that contain caffeine, alcohol, or large amounts of sugar. These cause you to lose more body fluid.  Avoid very cold drinks. They can cause stomach cramps.  Wear appropriate clothing. Choose lightweight, light-colored, loose-fitting clothing.  If you must be outdoors, try to limit your outdoor activity to morning and evening hours. Try to rest often in shady areas.  If you are not used to working or exercising in a hot environment, start slowly and pick up the pace gradually.  Stay cool in an air-conditioned place if possible. If your home does not have air conditioning, go to the shopping mall or [employer].  Taking a cool shower or bath may help you cool off. SEEK MEDICAL CARE IF:   You see any of the symptoms listed above. You may be dealing with a life-threatening emergency.  Symptoms worsen or last longer than 1 hour.  Heat cramps do not get better in 1 hour. MAKE SURE YOU:   Understand these instructions.  Will watch your condition.  Will get help right away if you are not doing well or get worse. Document Released: 04/20/2008 Document Revised: 10/04/2011 Document Reviewed: 04/20/2008 [facility] Patient Information 2015 [place], [place]. This information is not intended to replace advice given to you by your health care provider. Make sure you discuss any questions you have with your health care provider.  Hypokalemia Hypokalemia means that the amount of potassium in the blood is lower than normal.Potassium is a chemical, called an electrolyte, that helps  regulate the amount of fluid in the body. It also stimulates muscle contraction and helps nerves function properly.Most of the body's potassium is inside of cells, and only a very small amount is in the blood. Because the amount in the blood is so small, minor changes can be life-threatening. CAUSES  Antibiotics.  Diarrhea or vomiting.  Using laxatives too much, which can cause diarrhea.  Chronic kidney disease.  Water pills (diuretics).  Eating disorders (bulimia).  Low magnesium level.  Sweating a lot. SIGNS AND SYMPTOMS  Weakness.  Constipation.  Fatigue.  Muscle cramps.  Mental confusion.  Skipped heartbeats or irregular heartbeat (palpitations).  Tingling or numbness. DIAGNOSIS  Your health care provider can diagnose hypokalemia with blood tests. In addition to checking your potassium level, your health care provider may also check other lab tests. TREATMENT Hypokalemia can be treated with potassium supplements taken by mouth or adjustments in your current medicines. If your potassium level is very low, you may need to get potassium through a vein (IV) and be monitored in the hospital. A diet high in potassium is also helpful. Foods high in potassium are:  Nuts, such as peanuts and pistachios.  Seeds, such as sunflower seeds and pumpkin seeds.  Peas, lentils, and lima beans.  Whole grain and bran cereals and breads.  Fresh fruit and vegetables, such as apricots, avocado, bananas, cantaloupe, kiwi, oranges, tomatoes, asparagus, and potatoes.  Orange and tomato juices.  Red meats.  Fruit yogurt. HOME CARE INSTRUCTIONS  Take all medicines as prescribed by your health care provider.  Maintain a healthy diet by including nutritious food, such as fruits, vegetables, nuts, whole grains, and lean meats.  If you are taking a laxative, be sure to follow the directions on the label. SEEK MEDICAL CARE IF:  Your weakness gets worse.  You feel your heart  pounding or racing.  You are vomiting or having diarrhea.  You are diabetic and having trouble keeping your blood glucose in the normal range. SEEK IMMEDIATE MEDICAL CARE IF:  You have chest pain, shortness of breath, or dizziness.  You are vomiting or having diarrhea for more than 2 days.  You faint. MAKE SURE YOU:   Understand these instructions.  Will watch your condition.  Will get help right away if you are not doing well or get worse. Document Released: 07/12/2005 Document Revised: 05/02/2013 Document Reviewed: 01/12/2013 [facility] Patient Information 2015 [place], [place]. This information is not intended to replace advice given to you by your health care provider. Make sure you discuss any questions you have with your health care provider.

## 2014-12-27 NOTE — ED Notes (Signed)
Pt requesting to take a nap.

## 2014-12-27 NOTE — ED Provider Notes (Signed)
CSN: [id number]     Arrival date & time 12/27/14  0341 History   First MD Initiated Contact with Patient 12/27/14 [phone]     Chief Complaint  Patient presents with  . Chest Pain     (Consider location/radiation/quality/duration/timing/severity/associated sxs/prior Treatment) HPI patient reports he has been trying to lose weight. He has lost about 20 pounds by walking. He states today he walked during the heat of the day at 1 PM for about 1-1-1/2 hours. He states he was diaphoretic. He had one episode of a sharp pain on top of his head that lasted 1-2 seconds. He states later about 2:30 he started getting a lower chest discomfort that does not radiate and has been constant. He states it's not a pain. He describes as a pressure. He has had nausea without vomiting, no shortness of breath, no diaphoresis without the heat. He states laying on his side makes the pain worse, nothing makes it feel better. He states he's had this chest pain before with anxiety. He states his current pain is a 6 out of 10.  Patient states his family history is significant for his mother who had a three-vessel bypass done. She expired at age 75, he also has a brother who has had bypass surgery.  PCP Dr Loney Hering  Past Medical History  Diagnosis Date  . Diabetes mellitus   . Hypertension   . Sleep apnea   . DDD (degenerative disc disease), lumbar   . Depression   . Schizophrenia   . Hyperlipidemia   . Chronic back pain    Past Surgical History  Procedure Laterality Date  . Colonoscopy    . Cardiac catheterization  2005    normal coronary arteries   History reviewed. No pertinent family history. History  Substance Use Topics  . Smoking status: Former Games developer  . Smokeless tobacco: Not on file  . Alcohol Use: No  lives at home Lives with spouse On disability for his back  Review of Systems  All other systems reviewed and are negative.     Allergies  Codeine  Home Medications   Prior to Admission  medications   Medication Sig Start Date End Date Taking? Authorizing Provider  atenolol (TENORMIN) 25 MG tablet Take 25 mg by mouth daily.     Yes Historical Provider, MD  cloNIDine (CATAPRES) 0.1 MG tablet Take 0.2 mg by mouth at bedtime.     Yes Historical Provider, MD  diazepam (VALIUM) 10 MG tablet Take 2 mg by mouth once.    Yes Historical Provider, MD  diclofenac (VOLTAREN) 75 MG EC tablet Take 75 mg by mouth 2 (two) times daily.   Yes Historical Provider, MD  esomeprazole (NEXIUM) 40 MG capsule Take 40 mg by mouth daily at 12 noon.   Yes Historical Provider, MD  glimepiride (AMARYL) 2 MG tablet Take 1 mg by mouth every morning.    Yes Historical Provider, MD  hydrochlorothiazide 25 MG tablet Take 12.5 mg by mouth every morning.    Yes Historical Provider, MD  lisinopril (PRINIVIL,ZESTRIL) 10 MG tablet Take 5 mg by mouth every morning.   Yes Historical Provider, MD  metFORMIN (GLUCOPHAGE) 1000 MG tablet Take 1,000 mg by mouth 2 (two) times daily with a meal.     Yes Historical Provider, MD  potassium chloride SA (K-DUR,KLOR-CON) 20 MEQ tablet Take 1 tablet (20 mEq total) by mouth 2 (two) times daily. 12/27/14   Devoria Albe, MD  predniSONE (DELTASONE) 10 MG tablet Take 2 tablets (  20 mg total) by mouth 2 (two) times daily. 07/17/13   Geoffery Lyons, MD   BP 117/68 mmHg  Pulse 77  Temp(Src) 97.9 F (36.6 C)  Resp 20  Ht 6' (1.829 m)  Wt 297 lb 5 oz (134.86 kg)  BMI 40.31 kg/m2  SpO2 100%  Vital signs normal   Physical Exam  Constitutional: He is oriented to person, place, and time. He appears well-developed and well-nourished.  Non-toxic appearance. He does not appear ill. No distress.  HENT:  Head: Normocephalic and atraumatic.  Right Ear: External ear normal.  Left Ear: External ear normal.  Nose: Nose normal. No mucosal edema or rhinorrhea.  Mouth/Throat: Oropharynx is clear and moist and mucous membranes are normal. No dental abscesses or uvula swelling.  Eyes: Conjunctivae and EOM  are normal. Pupils are equal, round, and reactive to light.  Neck: Normal range of motion and full passive range of motion without pain. Neck supple.  Cardiovascular: Normal rate, regular rhythm and normal heart sounds.  Exam reveals no gallop and no friction rub.   No murmur heard. Pulmonary/Chest: Effort normal and breath sounds normal. No respiratory distress. He has no wheezes. He has no rhonchi. He has no rales. He exhibits no tenderness and no crepitus.    Area of pain  Abdominal: Soft. Normal appearance and bowel sounds are normal. He exhibits no distension. There is no tenderness. There is no rebound and no guarding.  Musculoskeletal: Normal range of motion. He exhibits no edema or tenderness.  Moves all extremities well.   Neurological: He is alert and oriented to person, place, and time. He has normal strength. No cranial nerve deficit.  Skin: Skin is warm, dry and intact. No rash noted. No erythema. No pallor.  Psychiatric: His speech is normal and behavior is normal. His mood appears anxious.  Nursing note and vitals reviewed.   ED Course  Procedures (including critical care time)  Medications  nitroGLYCERIN (NITROGLYN) 2 % ointment 1 inch (1 inch Topical Not Given 12/27/14 0452)  aspirin chewable tablet 324 mg (324 mg Oral Given 12/27/14 0451)  sodium chloride 0.9 % bolus 1,000 mL (0 mLs Intravenous Stopped 12/27/14 0603)  sodium chloride 0.9 % bolus 1,000 mL (1,000 mLs Intravenous New Bag/Given 12/27/14 0604)  potassium chloride SA (K-DUR,KLOR-CON) CR tablet 40 mEq (40 mEq Oral Given 12/27/14 0604)    Recheck patient states he's feeling better after IV fluids. He states his chest pain is almost gone. We discussed his test results. He again verifies his pain had been constant since 2 PM yesterday. Only one troponin was done. He was given oral potassium for his hypokalemia.  Labs Review Results for orders placed or performed during the hospital encounter of 12/27/14  CBC with  Differential  Result Value Ref Range   WBC 9.0 4.0 - 10.5 K/uL   RBC 4.86 4.22 - 5.81 MIL/uL   Hemoglobin 12.0 (L) 13.0 - 17.0 g/dL   HCT 16.1 (L) 09.6 - 04.5 %   MCV 76.1 (L) 78.0 - 100.0 fL   MCH 24.7 (L) 26.0 - 34.0 pg   MCHC 32.4 30.0 - 36.0 g/dL   RDW 40.9 81.1 - 91.4 %   Platelets 290 150 - 400 K/uL   Neutrophils Relative % 58 43 - 77 %   Neutro Abs 5.1 1.7 - 7.7 K/uL   Lymphocytes Relative 34 12 - 46 %   Lymphs Abs 3.0 0.7 - 4.0 K/uL   Monocytes Relative 8 3 - 12 %  Monocytes Absolute 0.7 0.1 - 1.0 K/uL   Eosinophils Relative 0 0 - 5 %   Eosinophils Absolute 0.0 0.0 - 0.7 K/uL   Basophils Relative 0 0 - 1 %   Basophils Absolute 0.0 0.0 - 0.1 K/uL  Troponin I  Result Value Ref Range   Troponin I <0.03 <0.031 ng/mL  Lipase, blood  Result Value Ref Range   Lipase 29 22 - 51 U/L  Comprehensive metabolic panel  Result Value Ref Range   Sodium 132 (L) 135 - 145 mmol/L   Potassium 3.2 (L) 3.5 - 5.1 mmol/L   Chloride 97 (L) 101 - 111 mmol/L   CO2 25 22 - 32 mmol/L   Glucose, Bld 148 (H) 65 - 99 mg/dL   BUN 36 (H) 6 - 20 mg/dL   Creatinine, Ser 1.611.58 (H) 0.61 - 1.24 mg/dL   Calcium 9.1 8.9 - 09.610.3 mg/dL   Total Protein 8.3 (H) 6.5 - 8.1 g/dL   Albumin 4.4 3.5 - 5.0 g/dL   AST 41 15 - 41 U/L   ALT 30 17 - 63 U/L   Alkaline Phosphatase 51 38 - 126 U/L   Total Bilirubin 0.8 0.3 - 1.2 mg/dL   GFR calc non Af Amer 48 (L) >60 mL/min   GFR calc Af Amer 56 (L) >60 mL/min   Anion gap 10 5 - 15  CK  Result Value Ref Range   Total CK 127 49 - 397 U/L   Laboratory interpretation all normal except hypokalemia, mild hyponatremia and low chloride, with new mild renal insufficiency, all consistent with dehydration     Imaging Review No results found.   EKG Interpretation None       ED ECG REPORT   Date: 12/27/2014  Rate: 76  Rhythm: normal sinus rhythm  QRS Axis: normal  Intervals: normal  ST/T Wave abnormalities: normal  Conduction Disutrbances:none  Narrative  Interpretation: Low voltage chest leads  Old EKG Reviewed: none available    MDM   Final diagnoses:  Atypical chest pain  Heat exhaustion, initial encounter  Hypokalemia    New Prescriptions   POTASSIUM CHLORIDE SA (K-DUR,KLOR-CON) 20 MEQ TABLET    Take 1 tablet (20 mEq total) by mouth 2 (two) times daily.    Plan discharge  Devoria AlbeIva Ladarrian Asencio, MD, Concha PyoFACEP     Kaylib Furness, MD 12/27/14 [phone]

## 2014-12-27 NOTE — ED Notes (Addendum)
Pt went for a walk yesterday around 12:30pm-1pm. Pt said he felt a sharp pain in his head and he has had a headache and some chest discomfort since then. Pt describes a constant pressure since this happened. Pt states he has a hx of anxiety.

## 2015-10-24 ENCOUNTER — Emergency Department (HOSPITAL_COMMUNITY): Payer: Medicare Other

## 2015-10-24 ENCOUNTER — Encounter (HOSPITAL_COMMUNITY): Payer: Self-pay | Admitting: *Deleted

## 2015-10-24 ENCOUNTER — Emergency Department (HOSPITAL_COMMUNITY)
Admission: EM | Admit: 2015-10-24 | Discharge: 2015-10-25 | Disposition: A | Discharge status: Home / Self Care | Payer: Medicare Other | Attending: Emergency Medicine | Admitting: Emergency Medicine

## 2015-10-24 DIAGNOSIS — Z7984 Long term (current) use of oral hypoglycemic drugs: Secondary | ICD-10-CM | POA: Diagnosis not present

## 2015-10-24 DIAGNOSIS — E785 Hyperlipidemia, unspecified: Secondary | ICD-10-CM | POA: Insufficient documentation

## 2015-10-24 DIAGNOSIS — E119 Type 2 diabetes mellitus without complications: Secondary | ICD-10-CM | POA: Insufficient documentation

## 2015-10-24 DIAGNOSIS — F209 Schizophrenia, unspecified: Secondary | ICD-10-CM | POA: Diagnosis not present

## 2015-10-24 DIAGNOSIS — R319 Hematuria, unspecified: Secondary | ICD-10-CM | POA: Insufficient documentation

## 2015-10-24 DIAGNOSIS — F329 Major depressive disorder, single episode, unspecified: Secondary | ICD-10-CM | POA: Diagnosis not present

## 2015-10-24 DIAGNOSIS — I10 Essential (primary) hypertension: Secondary | ICD-10-CM | POA: Insufficient documentation

## 2015-10-24 DIAGNOSIS — Z87891 Personal history of nicotine dependence: Secondary | ICD-10-CM | POA: Insufficient documentation

## 2015-10-24 DIAGNOSIS — Z79899 Other long term (current) drug therapy: Secondary | ICD-10-CM | POA: Insufficient documentation

## 2015-10-24 LAB — CBC WITH DIFFERENTIAL/PLATELET
Basophils Absolute: 0 10*3/uL (ref 0.0–0.1)
Basophils Relative: 0 %
EOS PCT: 0 %
Eosinophils Absolute: 0.1 10*3/uL (ref 0.0–0.7)
HEMATOCRIT: 36.2 % — AB (ref 39.0–52.0)
Hemoglobin: 11.9 g/dL — ABNORMAL LOW (ref 13.0–17.0)
LYMPHS ABS: 3.2 10*3/uL (ref 0.7–4.0)
LYMPHS PCT: 18 %
MCH: 25.5 pg — AB (ref 26.0–34.0)
MCHC: 32.9 g/dL (ref 30.0–36.0)
MCV: 77.7 fL — AB (ref 78.0–100.0)
Monocytes Absolute: 1.1 10*3/uL — ABNORMAL HIGH (ref 0.1–1.0)
Monocytes Relative: 7 %
NEUTROS ABS: 13.2 10*3/uL — AB (ref 1.7–7.7)
Neutrophils Relative %: 75 %
PLATELETS: 358 10*3/uL (ref 150–400)
RBC: 4.66 MIL/uL (ref 4.22–5.81)
RDW: 15.3 % (ref 11.5–15.5)
WBC: 17.6 10*3/uL — AB (ref 4.0–10.5)

## 2015-10-24 LAB — COMPREHENSIVE METABOLIC PANEL
ALK PHOS: 82 U/L (ref 38–126)
ALT: 25 U/L (ref 17–63)
AST: 29 U/L (ref 15–41)
Albumin: 3.8 g/dL (ref 3.5–5.0)
Anion gap: 11 (ref 5–15)
BUN: 14 mg/dL (ref 6–20)
CALCIUM: 9.7 mg/dL (ref 8.9–10.3)
CO2: 27 mmol/L (ref 22–32)
Chloride: 100 mmol/L — ABNORMAL LOW (ref 101–111)
Creatinine, Ser: 0.99 mg/dL (ref 0.61–1.24)
Glucose, Bld: 146 mg/dL — ABNORMAL HIGH (ref 65–99)
Potassium: 3.3 mmol/L — ABNORMAL LOW (ref 3.5–5.1)
Sodium: 138 mmol/L (ref 135–145)
TOTAL PROTEIN: 7.8 g/dL (ref 6.5–8.1)
Total Bilirubin: 0.9 mg/dL (ref 0.3–1.2)

## 2015-10-24 LAB — URINALYSIS, ROUTINE W REFLEX MICROSCOPIC
Bilirubin Urine: NEGATIVE
Ketones, ur: NEGATIVE mg/dL
Nitrite: NEGATIVE
Protein, ur: 100 mg/dL — AB
SPECIFIC GRAVITY, URINE: 1.01 (ref 1.005–1.030)
pH: 8.5 — ABNORMAL HIGH (ref 5.0–8.0)

## 2015-10-24 LAB — URINE MICROSCOPIC-ADD ON
Bacteria, UA: NONE SEEN
SQUAMOUS EPITHELIAL / LPF: NONE SEEN

## 2015-10-24 MED ORDER — CEFTRIAXONE SODIUM 1 G IJ SOLR
1.0000 g | Freq: Once | INTRAMUSCULAR | Status: AC
  Administered 2015-10-24: 1 g via INTRAVENOUS
  Filled 2015-10-24: qty 10

## 2015-10-24 MED ORDER — HYDROMORPHONE HCL 1 MG/ML IJ SOLN
1.0000 mg | Freq: Once | INTRAMUSCULAR | Status: AC
  Administered 2015-10-24: 1 mg via INTRAVENOUS
  Filled 2015-10-24: qty 1

## 2015-10-24 MED ORDER — ONDANSETRON HCL 4 MG/2ML IJ SOLN
4.0000 mg | Freq: Once | INTRAMUSCULAR | Status: AC
  Administered 2015-10-24: 4 mg via INTRAVENOUS
  Filled 2015-10-24: qty 2

## 2015-10-24 NOTE — ED Notes (Signed)
Pt c/o urinating almost pure blood since this morning as well as having testicle pain.

## 2015-10-24 NOTE — ED Provider Notes (Signed)
CSN: [id number]     Arrival date & time 10/24/15  1951 History  By signing my name below, I, Phillis HaggisGabriella Gaje, attest that this documentation has been prepared under the direction and in the presence of Bethann BerkshireJoseph Haydyn Girvan, MD. Electronically Signed: Phillis HaggisGabriella Gaje, ED Scribe. 10/24/2015. 9:48 PM.  Chief Complaint  Patient presents with  . Hematuria   Patient is a 55 y.o. male presenting with hematuria. The history is provided by the patient. No language interpreter was used.  Hematuria This is a new problem. The current episode started 12 to 24 hours ago. The problem occurs constantly. The problem has been gradually worsening. Pertinent negatives include no chest pain, no abdominal pain and no headaches. He has tried nothing for the symptoms.  HPI Comments: Xavier Portserry L Poss is a 55 y.o. Male with a hx of DM, HTN, and chronic back pain who presents to the Emergency Department complaining of hematuria onset one day ago. Pt reports associated low back pain and testicular pain. He has not tried anything for his symptoms. He denies hx of similar symptoms or a hx of kidney stones. He denies abdominal pain.   Past Medical History  Diagnosis Date  . Diabetes mellitus   . Hypertension   . Sleep apnea   . DDD (degenerative disc disease), lumbar   . Depression   . Schizophrenia (HCC)   . Hyperlipidemia   . Chronic back pain    Past Surgical History  Procedure Laterality Date  . Colonoscopy    . Cardiac catheterization  2005    normal coronary arteries   No family history on file. Social History  Substance Use Topics  . Smoking status: Former Games developermoker  . Smokeless tobacco: None  . Alcohol Use: No    Review of Systems  Constitutional: Negative for appetite change and fatigue.  HENT: Negative for congestion, ear discharge and sinus pressure.   Eyes: Negative for discharge.  Respiratory: Negative for cough.   Cardiovascular: Negative for chest pain.  Gastrointestinal: Negative for abdominal pain and  diarrhea.  Genitourinary: Positive for hematuria and testicular pain. Negative for frequency.  Musculoskeletal: Negative for back pain.  Skin: Negative for rash.  Neurological: Negative for seizures and headaches.  Psychiatric/Behavioral: Negative for hallucinations.      Allergies  Codeine  Home Medications   Prior to Admission medications   Medication Sig Start Date End Date Taking? Authorizing Provider  atenolol (TENORMIN) 25 MG tablet Take 25 mg by mouth daily.      Historical Provider, MD  cloNIDine (CATAPRES) 0.1 MG tablet Take 0.2 mg by mouth at bedtime.      Historical Provider, MD  diazepam (VALIUM) 10 MG tablet Take 2 mg by mouth once.     Historical Provider, MD  diclofenac (VOLTAREN) 75 MG EC tablet Take 75 mg by mouth 2 (two) times daily.    Historical Provider, MD  esomeprazole (NEXIUM) 40 MG capsule Take 40 mg by mouth daily at 12 noon.    Historical Provider, MD  glimepiride (AMARYL) 2 MG tablet Take 1 mg by mouth every morning.     Historical Provider, MD  hydrochlorothiazide 25 MG tablet Take 12.5 mg by mouth every morning.     Historical Provider, MD  lisinopril (PRINIVIL,ZESTRIL) 10 MG tablet Take 5 mg by mouth every morning.    Historical Provider, MD  metFORMIN (GLUCOPHAGE) 1000 MG tablet Take 1,000 mg by mouth 2 (two) times daily with a meal.      Historical Provider, MD  potassium chloride SA (K-DUR,KLOR-CON) 20 MEQ tablet Take 1 tablet (20 mEq total) by mouth 2 (two) times daily. 12/27/14   Devoria Albe, MD  predniSONE (DELTASONE) 10 MG tablet Take 2 tablets (20 mg total) by mouth 2 (two) times daily. 07/17/13   Geoffery Lyons, MD   BP 122/77 mmHg  Pulse 105  Temp(Src) 98.8 F (37.1 C) (Oral)  Resp 20  Wt 297 lb (134.718 kg)  SpO2 100% Physical Exam  Constitutional: He is oriented to person, place, and time. He appears well-developed.  HENT:  Head: Normocephalic.  Eyes: Conjunctivae and EOM are normal. No scleral icterus.  Neck: Neck supple. No thyromegaly  present.  Cardiovascular: Normal rate and regular rhythm.  Exam reveals no gallop and no friction rub.   No murmur heard. Pulmonary/Chest: No stridor. He has no wheezes. He has no rales. He exhibits no tenderness.  Abdominal: He exhibits no distension. There is no tenderness. There is CVA tenderness. There is no rebound.  Left flank tenderness  Musculoskeletal: Normal range of motion. He exhibits no edema.  Lymphadenopathy:    He has no cervical adenopathy.  Neurological: He is oriented to person, place, and time. He exhibits normal muscle tone. Coordination normal.  Skin: No rash noted. No erythema.  Psychiatric: He has a normal mood and affect. His behavior is normal.    ED Course  Procedures (including critical care time) DIAGNOSTIC STUDIES: Oxygen Saturation is 100% on RA, normal by my interpretation.    COORDINATION OF CARE: 9:46 PM-Discussed treatment plan which includes labs with pt at bedside and pt agreed to plan.    Labs Review Labs Reviewed  URINALYSIS, ROUTINE W REFLEX MICROSCOPIC (NOT AT [facility]) - Abnormal; Notable for the following:    Color, Urine RED (*)    APPearance CLOUDY (*)    pH 8.5 (*)    Glucose, UA >1000 (*)    Hgb urine dipstick LARGE (*)    Protein, ur 100 (*)    Leukocytes, UA SMALL (*)    All other components within normal limits  CBC WITH DIFFERENTIAL/PLATELET - Abnormal; Notable for the following:    WBC 17.6 (*)    Hemoglobin 11.9 (*)    HCT 36.2 (*)    MCV 77.7 (*)    MCH 25.5 (*)    Neutro Abs 13.2 (*)    Monocytes Absolute 1.1 (*)    All other components within normal limits  COMPREHENSIVE METABOLIC PANEL - Abnormal; Notable for the following:    Potassium 3.3 (*)    Chloride 100 (*)    Glucose, Bld 146 (*)    All other components within normal limits  URINE CULTURE  URINE MICROSCOPIC-ADD ON    Imaging Review No results found. I have personally reviewed and evaluated these images and lab results as part of my medical  decision-making.   EKG Interpretation None      MDM   Final diagnoses:  None    Patient with hematuria. CT scan suggests prostatitis. Patient will get urine culture be put on Cipro and Vicodin and will follow-up with his PCP this week  The chart was scribed for me under my direct supervision.  I personally performed the history, physical, and medical decision making and all procedures in the evaluation of this patient.Bethann Berkshire, MD 10/25/15 0030

## 2015-10-25 MED ORDER — CIPROFLOXACIN HCL 500 MG PO TABS: 500.0000 mg | ORAL_TABLET | Freq: Two times a day (BID) | ORAL | Status: DC

## 2015-10-25 MED ORDER — HYDROCODONE-ACETAMINOPHEN 5-325 MG PO TABS: 1.0000 | ORAL_TABLET | Freq: Four times a day (QID) | ORAL | Status: DC | PRN

## 2015-10-25 NOTE — Discharge Instructions (Signed)
Follow up with your md next week. °

## 2015-10-28 LAB — URINE CULTURE: Special Requests: NORMAL

## 2015-10-29 ENCOUNTER — Telehealth (HOSPITAL_COMMUNITY): Payer: Self-pay

## 2015-10-29 NOTE — Telephone Encounter (Signed)
Post ED Visit - Positive Culture Follow-up  Culture report reviewed by antimicrobial stewardship pharmacist:  []  Enzo BiNathan Batchelder, Pharm.D. []  Celedonio MiyamotoJeremy Frens, Pharm.D., BCPS []  Garvin FilaMike Maccia, Pharm.D. []  Georgina PillionElizabeth Martin, Pharm.D., BCPS []  TiawahMinh Pham, [address]Ph[address]D., BCPS, AAHIVP []  Estella HuskMichelle Turner, Pharm.D., BCPS, AAHIVP []  Tennis Mustassie Stewart, Pharm.D. []  Sherle Poeob Vincent, [address]Ph[address] Darin EngelsX  Meagan Miles, Pharm.D.  Positive urine culture, >/= 100,000 colonies -> E Coli Treated with Ciprofloxacin, organism sensitive to the same and no further patient follow-up is required at this time.  Arvid RightClark, Hadlyn Amero Dorn 10/29/2015, 9:47 AM

## 2015-12-11 ENCOUNTER — Emergency Department (HOSPITAL_COMMUNITY): Payer: Medicare Other

## 2015-12-11 ENCOUNTER — Emergency Department (HOSPITAL_COMMUNITY)
Admission: EM | Admit: 2015-12-11 | Discharge: 2015-12-11 | Disposition: A | Discharge status: Home / Self Care | Payer: Medicare Other | Attending: Emergency Medicine | Admitting: Emergency Medicine

## 2015-12-11 ENCOUNTER — Encounter (HOSPITAL_COMMUNITY): Payer: Self-pay | Admitting: Cardiology

## 2015-12-11 DIAGNOSIS — Z79899 Other long term (current) drug therapy: Secondary | ICD-10-CM | POA: Diagnosis not present

## 2015-12-11 DIAGNOSIS — Z79891 Long term (current) use of opiate analgesic: Secondary | ICD-10-CM | POA: Insufficient documentation

## 2015-12-11 DIAGNOSIS — R079 Chest pain, unspecified: Secondary | ICD-10-CM

## 2015-12-11 DIAGNOSIS — Z7982 Long term (current) use of aspirin: Secondary | ICD-10-CM | POA: Insufficient documentation

## 2015-12-11 DIAGNOSIS — R0602 Shortness of breath: Secondary | ICD-10-CM | POA: Diagnosis not present

## 2015-12-11 DIAGNOSIS — Z87891 Personal history of nicotine dependence: Secondary | ICD-10-CM | POA: Insufficient documentation

## 2015-12-11 DIAGNOSIS — E119 Type 2 diabetes mellitus without complications: Secondary | ICD-10-CM | POA: Insufficient documentation

## 2015-12-11 DIAGNOSIS — E785 Hyperlipidemia, unspecified: Secondary | ICD-10-CM | POA: Insufficient documentation

## 2015-12-11 DIAGNOSIS — F329 Major depressive disorder, single episode, unspecified: Secondary | ICD-10-CM | POA: Diagnosis not present

## 2015-12-11 DIAGNOSIS — R42 Dizziness and giddiness: Secondary | ICD-10-CM | POA: Insufficient documentation

## 2015-12-11 DIAGNOSIS — F209 Schizophrenia, unspecified: Secondary | ICD-10-CM | POA: Diagnosis not present

## 2015-12-11 DIAGNOSIS — Z7984 Long term (current) use of oral hypoglycemic drugs: Secondary | ICD-10-CM | POA: Insufficient documentation

## 2015-12-11 DIAGNOSIS — I10 Essential (primary) hypertension: Secondary | ICD-10-CM | POA: Diagnosis not present

## 2015-12-11 DIAGNOSIS — R51 Headache: Secondary | ICD-10-CM | POA: Insufficient documentation

## 2015-12-11 DIAGNOSIS — R072 Precordial pain: Secondary | ICD-10-CM | POA: Diagnosis not present

## 2015-12-11 HISTORY — DX: Calculus of kidney: N20.0

## 2015-12-11 LAB — BASIC METABOLIC PANEL
Anion gap: 11 (ref 5–15)
BUN: 18 mg/dL (ref 6–20)
CHLORIDE: 101 mmol/L (ref 101–111)
CO2: 23 mmol/L (ref 22–32)
CREATININE: 1.23 mg/dL (ref 0.61–1.24)
Calcium: 9.3 mg/dL (ref 8.9–10.3)
GFR calc non Af Amer: >60 mL/min (ref >60)
GLUCOSE: 201 mg/dL — AB (ref 65–99)
Potassium: 3.8 mmol/L (ref 3.5–5.1)
Sodium: 135 mmol/L (ref 135–145)

## 2015-12-11 LAB — CBC
HCT: 37.9 % — ABNORMAL LOW (ref 39.0–52.0)
Hemoglobin: 11.9 g/dL — ABNORMAL LOW (ref 13.0–17.0)
MCH: 24.5 pg — AB (ref 26.0–34.0)
MCHC: 31.4 g/dL (ref 30.0–36.0)
MCV: 78 fL (ref 78.0–100.0)
PLATELETS: 399 10*3/uL (ref 150–400)
RBC: 4.86 MIL/uL (ref 4.22–5.81)
RDW: 15.1 % (ref 11.5–15.5)
WBC: 8.6 10*3/uL (ref 4.0–10.5)

## 2015-12-11 LAB — TROPONIN I

## 2015-12-11 LAB — D-DIMER, QUANTITATIVE: D-Dimer, Quant: 0.36 ug/mL-FEU (ref 0.00–0.50)

## 2015-12-11 LAB — I-STAT TROPONIN, ED: Troponin i, poc: 0 ng/mL (ref 0.00–0.08)

## 2015-12-11 NOTE — ED Provider Notes (Signed)
CSN: [id number]     Arrival date & time 12/11/15  1413 History   First MD Initiated Contact with Patient 12/11/15 1451     Chief Complaint  Patient presents with  . Chest Pain    Patient is a 55 y.o. male presenting with chest pain. The history is provided by the patient.  Chest Pain Pain location:  Substernal area Pain quality: sharp   Pain radiates to:  Does not radiate Pain severity:  Moderate Duration:  1 month Timing:  Constant Progression:  Worsening Relieved by:  Nothing Worsened by:  Exertion Associated symptoms: dizziness, headache (chronic migraines) and shortness of breath   Associated symptoms: no cough and no fever   The chest pain comes and goes.  It lasts maybe 15 minutes at a time.  He is getting short of breath now with less and less exertion.  No leg swelling.  No hx of heart disease or PE.  Past Medical History  Diagnosis Date  . Diabetes mellitus   . Hypertension   . Sleep apnea   . DDD (degenerative disc disease), lumbar   . Depression   . Schizophrenia (HCC)   . Hyperlipidemia   . Chronic back pain   . Kidney stones    Past Surgical History  Procedure Laterality Date  . Colonoscopy    . Cardiac catheterization  2005    normal coronary arteries   History reviewed. No pertinent family history. Social History  Substance Use Topics  . Smoking status: Former Games developermoker  . Smokeless tobacco: None  . Alcohol Use: No    Review of Systems  Constitutional: Negative for fever.  Respiratory: Positive for shortness of breath. Negative for cough.   Cardiovascular: Positive for chest pain.  Neurological: Positive for dizziness, light-headedness and headaches (chronic migraines).  All other systems reviewed and are negative.     Allergies  Codeine and Morphine and related  Home Medications   Prior to Admission medications   Medication Sig Start Date End Date Taking? Authorizing Provider  allopurinol (ZYLOPRIM) 100 MG tablet Take 100 mg by mouth  daily.  12/04/15  Yes Historical Provider, MD  aspirin EC 81 MG tablet Take 81 mg by mouth every morning.   Yes Historical Provider, MD  aspirin-acetaminophen-caffeine (EXCEDRIN MIGRAINE) [id number] MG tablet Take 2 tablets by mouth every 6 (six) hours as needed for headache.   Yes Historical Provider, MD  atenolol (TENORMIN) 25 MG tablet Take 25 mg by mouth daily.     Yes Historical Provider, MD  atorvastatin (LIPITOR) 40 MG tablet Take 40 mg by mouth daily. 10/10/15  Yes Historical Provider, MD  DULoxetine (CYMBALTA) 60 MG capsule Take 60 mg by mouth daily.  12/09/15  Yes Historical Provider, MD  esomeprazole (NEXIUM) 40 MG capsule Take 40 mg by mouth every morning.  10/10/15  Yes Historical Provider, MD  glimepiride (AMARYL) 2 MG tablet Take 1 mg by mouth every morning.    Yes Historical Provider, MD  hydrochlorothiazide 25 MG tablet Take 12.5 mg by mouth every morning.    Yes Historical Provider, MD  HYDROcodone-acetaminophen (NORCO/VICODIN) 5-325 MG tablet Take 1 tablet by mouth every 6 (six) hours as needed. 10/25/15  Yes Bethann BerkshireJoseph Zammit, MD  lisinopril (PRINIVIL,ZESTRIL) 10 MG tablet Take 5 mg by mouth every morning.   Yes Historical Provider, MD  meloxicam (MOBIC) 15 MG tablet Take 15 mg by mouth daily.  12/09/15  Yes Historical Provider, MD  metFORMIN (GLUCOPHAGE) 1000 MG tablet Take 1,000 mg by mouth  2 (two) times daily with a meal.     Yes Historical Provider, MD  Omega-3 Fatty Acids (FISH OIL PO) Take 1 capsule by mouth 2 (two) times daily.   Yes Historical Provider, MD  polyethylene glycol powder (GLYCOLAX/MIRALAX) powder Take 17 g by mouth daily. 10/10/15  Yes Historical Provider, MD  potassium citrate (UROCIT-K) 10 MEQ (1080 MG) SR tablet Take 10 mEq by mouth daily.  12/04/15  Yes Historical Provider, MD  tamsulosin (FLOMAX) 0.4 MG CAPS capsule Take 0.4 mg by mouth daily.  11/25/15  Yes Historical Provider, MD  ciprofloxacin (CIPRO) 500 MG tablet Take 1 tablet (500 mg total) by mouth 2 (two) times  daily. One po bid x 7 days Patient not taking: Reported on 12/11/2015 10/25/15   Bethann Berkshire, MD   BP 98/84 mmHg  Pulse 88  Temp(Src) 97.5 F (36.4 C) (Oral)  Resp 22  Ht 6' (1.829 m)  Wt 130.182 kg  BMI 38.92 kg/m2  SpO2 97% Physical Exam  Constitutional: He appears well-developed and well-nourished. No distress.  HENT:  Head: Normocephalic and atraumatic.  Right Ear: External ear normal.  Left Ear: External ear normal.  Eyes: Conjunctivae are normal. Right eye exhibits no discharge. Left eye exhibits no discharge. No scleral icterus.  Neck: Neck supple. No tracheal deviation present.  Cardiovascular: Normal rate, regular rhythm and intact distal pulses.   Pulmonary/Chest: Effort normal and breath sounds normal. No stridor. No respiratory distress. He has no wheezes. He has no rales.  Abdominal: Soft. Bowel sounds are normal. He exhibits no distension. There is no tenderness. There is no rebound and no guarding.  Musculoskeletal: He exhibits no edema or tenderness.  Neurological: He is alert. He has normal strength. No cranial nerve deficit (no facial droop, extraocular movements intact, no slurred speech) or sensory deficit. He exhibits normal muscle tone. He displays no seizure activity. Coordination normal.  Skin: Skin is warm and dry. No rash noted.  Psychiatric: He has a normal mood and affect.  Nursing note and vitals reviewed.   ED Course  Procedures (including critical care time) Labs Review Labs Reviewed  BASIC METABOLIC PANEL - Abnormal; Notable for the following:    Glucose, Bld 201 (*)    All other components within normal limits  CBC - Abnormal; Notable for the following:    Hemoglobin 11.9 (*)    HCT 37.9 (*)    MCH 24.5 (*)    All other components within normal limits  D-DIMER, QUANTITATIVE (NOT AT [facility])  TROPONIN I  TROPONIN I  Rosezena Sensor, ED    Imaging Review Dg Chest 2 View  12/11/2015  CLINICAL DATA:  Shortness of breath and dizziness this  morning. Initial encounter. EXAM: CHEST  2 VIEW COMPARISON:  PA and lateral chest 02/20/2011. FINDINGS: The lungs are clear. Heart size is normal. No pneumothorax or pleural effusion. No focal bony abnormality. IMPRESSION: Negative chest. Electronically Signed   By: Drusilla Kanner M.D.   On: 12/11/2015 15:12   I have personally reviewed and evaluated these images and lab results as part of my medical decision-making.   EKG Interpretation   Date/Time:  Thursday Dec 11 2015 14:21:52 EDT Ventricular Rate:  98 PR Interval:  146 QRS Duration: 86 QT Interval:  310 QTC Calculation: 396 R Axis:   64 Text Interpretation:  Sinus rhythm No significant change since last  tracing except artifact in leads I and III Confirmed by Esme Durkin  MD-J, Brayah Urquilla  ([id number]) on 12/11/2015 3:17:01 PM  MDM   Final diagnoses:  Chest pain, unspecified chest pain type    No hx of heart disease but several risk factors.  Heart score 4, moderate risk.  Pt has had prior evaluations for chest pain.  He states he had what sounds like a nuclear medicine stress test in the past year that was normal.    Pt was monitored in the ED.  Serial enzymes are negative.  No pna, no chf.  Sx could be related to his smoking history although the CXR does not show significant COPD.    Doubt patient is having ACS or PE.  Recommend close follow up with PCP, cardiologist.    Linwood Dibbles, MD 12/11/15 [phone]

## 2015-12-11 NOTE — Discharge Instructions (Signed)

## 2015-12-11 NOTE — ED Notes (Signed)
Pt resting quietly.  Denies any chest pain.  Updated on wait of second troponin to result.

## 2015-12-11 NOTE — ED Notes (Signed)
Sob times several days.  Chest pain, neck pain and dizziness that started prior  to arrival

## 2016-08-10 DIAGNOSIS — E1165 Type 2 diabetes mellitus with hyperglycemia: Secondary | ICD-10-CM | POA: Diagnosis not present

## 2016-08-17 DIAGNOSIS — R300 Dysuria: Secondary | ICD-10-CM | POA: Diagnosis not present

## 2016-10-08 DIAGNOSIS — Z79899 Other long term (current) drug therapy: Secondary | ICD-10-CM | POA: Diagnosis not present

## 2016-10-08 DIAGNOSIS — Z7984 Long term (current) use of oral hypoglycemic drugs: Secondary | ICD-10-CM | POA: Diagnosis not present

## 2016-10-08 DIAGNOSIS — Z7982 Long term (current) use of aspirin: Secondary | ICD-10-CM | POA: Diagnosis not present

## 2016-10-08 DIAGNOSIS — G43009 Migraine without aura, not intractable, without status migrainosus: Secondary | ICD-10-CM | POA: Diagnosis not present

## 2016-10-08 DIAGNOSIS — I10 Essential (primary) hypertension: Secondary | ICD-10-CM | POA: Diagnosis not present

## 2016-10-08 DIAGNOSIS — K219 Gastro-esophageal reflux disease without esophagitis: Secondary | ICD-10-CM | POA: Diagnosis not present

## 2016-10-08 DIAGNOSIS — E119 Type 2 diabetes mellitus without complications: Secondary | ICD-10-CM | POA: Diagnosis not present

## 2016-10-08 DIAGNOSIS — Z87891 Personal history of nicotine dependence: Secondary | ICD-10-CM | POA: Diagnosis not present

## 2016-10-08 DIAGNOSIS — F329 Major depressive disorder, single episode, unspecified: Secondary | ICD-10-CM | POA: Diagnosis not present

## 2016-10-08 DIAGNOSIS — R51 Headache: Secondary | ICD-10-CM | POA: Diagnosis not present

## 2016-10-19 DIAGNOSIS — G40A09 Absence epileptic syndrome, not intractable, without status epilepticus: Secondary | ICD-10-CM | POA: Diagnosis not present

## 2016-11-05 DIAGNOSIS — E1140 Type 2 diabetes mellitus with diabetic neuropathy, unspecified: Secondary | ICD-10-CM | POA: Diagnosis not present

## 2016-11-05 DIAGNOSIS — G43701 Chronic migraine without aura, not intractable, with status migrainosus: Secondary | ICD-10-CM | POA: Diagnosis not present

## 2016-11-05 DIAGNOSIS — E785 Hyperlipidemia, unspecified: Secondary | ICD-10-CM | POA: Diagnosis not present

## 2016-11-05 DIAGNOSIS — F419 Anxiety disorder, unspecified: Secondary | ICD-10-CM | POA: Diagnosis not present

## 2016-11-05 DIAGNOSIS — R569 Unspecified convulsions: Secondary | ICD-10-CM | POA: Diagnosis not present

## 2016-11-05 DIAGNOSIS — I10 Essential (primary) hypertension: Secondary | ICD-10-CM | POA: Diagnosis not present

## 2016-11-05 DIAGNOSIS — G4733 Obstructive sleep apnea (adult) (pediatric): Secondary | ICD-10-CM | POA: Diagnosis not present

## 2016-11-05 DIAGNOSIS — M545 Low back pain: Secondary | ICD-10-CM | POA: Diagnosis not present

## 2016-11-22 DIAGNOSIS — Z79899 Other long term (current) drug therapy: Secondary | ICD-10-CM | POA: Diagnosis not present

## 2016-11-22 DIAGNOSIS — E538 Deficiency of other specified B group vitamins: Secondary | ICD-10-CM | POA: Diagnosis not present

## 2016-11-22 DIAGNOSIS — R5383 Other fatigue: Secondary | ICD-10-CM | POA: Diagnosis not present

## 2016-11-22 DIAGNOSIS — M818 Other osteoporosis without current pathological fracture: Secondary | ICD-10-CM | POA: Diagnosis not present

## 2016-11-22 DIAGNOSIS — E559 Vitamin D deficiency, unspecified: Secondary | ICD-10-CM | POA: Diagnosis not present

## 2016-11-25 DIAGNOSIS — R569 Unspecified convulsions: Secondary | ICD-10-CM | POA: Diagnosis not present

## 2016-12-01 DIAGNOSIS — Z125 Encounter for screening for malignant neoplasm of prostate: Secondary | ICD-10-CM | POA: Diagnosis not present

## 2016-12-01 DIAGNOSIS — E118 Type 2 diabetes mellitus with unspecified complications: Secondary | ICD-10-CM | POA: Diagnosis not present

## 2016-12-01 DIAGNOSIS — M25561 Pain in right knee: Secondary | ICD-10-CM | POA: Diagnosis not present

## 2016-12-01 DIAGNOSIS — E559 Vitamin D deficiency, unspecified: Secondary | ICD-10-CM | POA: Diagnosis not present

## 2016-12-01 DIAGNOSIS — N139 Obstructive and reflux uropathy, unspecified: Secondary | ICD-10-CM | POA: Diagnosis not present

## 2016-12-01 DIAGNOSIS — Z6839 Body mass index (BMI) 39.0-39.9, adult: Secondary | ICD-10-CM | POA: Diagnosis not present

## 2016-12-01 DIAGNOSIS — Z0000 Encounter for general adult medical examination without abnormal findings: Secondary | ICD-10-CM | POA: Diagnosis not present

## 2016-12-01 DIAGNOSIS — I10 Essential (primary) hypertension: Secondary | ICD-10-CM | POA: Diagnosis not present

## 2016-12-01 DIAGNOSIS — Z1211 Encounter for screening for malignant neoplasm of colon: Secondary | ICD-10-CM | POA: Diagnosis not present

## 2016-12-01 DIAGNOSIS — E7801 Familial hypercholesterolemia: Secondary | ICD-10-CM | POA: Diagnosis not present

## 2016-12-01 DIAGNOSIS — M25562 Pain in left knee: Secondary | ICD-10-CM | POA: Diagnosis not present

## 2016-12-03 DIAGNOSIS — G4089 Other seizures: Secondary | ICD-10-CM | POA: Diagnosis not present

## 2016-12-03 DIAGNOSIS — G4733 Obstructive sleep apnea (adult) (pediatric): Secondary | ICD-10-CM | POA: Diagnosis not present

## 2016-12-03 DIAGNOSIS — E1140 Type 2 diabetes mellitus with diabetic neuropathy, unspecified: Secondary | ICD-10-CM | POA: Diagnosis not present

## 2016-12-03 DIAGNOSIS — I10 Essential (primary) hypertension: Secondary | ICD-10-CM | POA: Diagnosis not present

## 2016-12-03 DIAGNOSIS — E785 Hyperlipidemia, unspecified: Secondary | ICD-10-CM | POA: Diagnosis not present

## 2016-12-03 DIAGNOSIS — F419 Anxiety disorder, unspecified: Secondary | ICD-10-CM | POA: Diagnosis not present

## 2016-12-03 DIAGNOSIS — G43701 Chronic migraine without aura, not intractable, with status migrainosus: Secondary | ICD-10-CM | POA: Diagnosis not present

## 2016-12-03 DIAGNOSIS — M545 Low back pain: Secondary | ICD-10-CM | POA: Diagnosis not present

## 2017-01-14 DIAGNOSIS — E119 Type 2 diabetes mellitus without complications: Secondary | ICD-10-CM | POA: Diagnosis not present

## 2017-01-14 DIAGNOSIS — K219 Gastro-esophageal reflux disease without esophagitis: Secondary | ICD-10-CM | POA: Diagnosis not present

## 2017-01-14 DIAGNOSIS — Z6839 Body mass index (BMI) 39.0-39.9, adult: Secondary | ICD-10-CM | POA: Diagnosis not present

## 2017-01-14 DIAGNOSIS — I10 Essential (primary) hypertension: Secondary | ICD-10-CM | POA: Diagnosis not present

## 2017-01-14 DIAGNOSIS — E782 Mixed hyperlipidemia: Secondary | ICD-10-CM | POA: Diagnosis not present

## 2017-02-23 DIAGNOSIS — R0789 Other chest pain: Secondary | ICD-10-CM | POA: Diagnosis not present

## 2017-02-23 DIAGNOSIS — R079 Chest pain, unspecified: Secondary | ICD-10-CM | POA: Diagnosis not present

## 2017-02-23 DIAGNOSIS — I10 Essential (primary) hypertension: Secondary | ICD-10-CM | POA: Diagnosis not present

## 2017-02-23 DIAGNOSIS — Z7982 Long term (current) use of aspirin: Secondary | ICD-10-CM | POA: Diagnosis not present

## 2017-02-23 DIAGNOSIS — E785 Hyperlipidemia, unspecified: Secondary | ICD-10-CM | POA: Diagnosis not present

## 2017-02-23 DIAGNOSIS — E119 Type 2 diabetes mellitus without complications: Secondary | ICD-10-CM | POA: Diagnosis not present

## 2017-02-23 DIAGNOSIS — Z79899 Other long term (current) drug therapy: Secondary | ICD-10-CM | POA: Diagnosis not present

## 2017-02-23 DIAGNOSIS — M1710 Unilateral primary osteoarthritis, unspecified knee: Secondary | ICD-10-CM | POA: Diagnosis not present

## 2017-02-23 DIAGNOSIS — G8929 Other chronic pain: Secondary | ICD-10-CM | POA: Diagnosis not present

## 2017-02-23 DIAGNOSIS — Z87891 Personal history of nicotine dependence: Secondary | ICD-10-CM | POA: Diagnosis not present

## 2017-02-23 DIAGNOSIS — M545 Low back pain: Secondary | ICD-10-CM | POA: Diagnosis not present

## 2017-02-23 DIAGNOSIS — F329 Major depressive disorder, single episode, unspecified: Secondary | ICD-10-CM | POA: Diagnosis not present

## 2017-02-23 DIAGNOSIS — F4310 Post-traumatic stress disorder, unspecified: Secondary | ICD-10-CM | POA: Diagnosis not present

## 2017-02-23 DIAGNOSIS — F411 Generalized anxiety disorder: Secondary | ICD-10-CM | POA: Diagnosis not present

## 2017-02-23 DIAGNOSIS — M47816 Spondylosis without myelopathy or radiculopathy, lumbar region: Secondary | ICD-10-CM | POA: Diagnosis not present

## 2017-02-23 DIAGNOSIS — Z6841 Body Mass Index (BMI) 40.0 and over, adult: Secondary | ICD-10-CM | POA: Diagnosis not present

## 2017-02-23 DIAGNOSIS — Z7984 Long term (current) use of oral hypoglycemic drugs: Secondary | ICD-10-CM | POA: Diagnosis not present

## 2017-02-23 DIAGNOSIS — K219 Gastro-esophageal reflux disease without esophagitis: Secondary | ICD-10-CM | POA: Diagnosis not present

## 2017-02-23 DIAGNOSIS — G4730 Sleep apnea, unspecified: Secondary | ICD-10-CM | POA: Diagnosis not present

## 2017-02-24 DIAGNOSIS — R0789 Other chest pain: Secondary | ICD-10-CM | POA: Diagnosis not present

## 2017-03-08 DIAGNOSIS — G43701 Chronic migraine without aura, not intractable, with status migrainosus: Secondary | ICD-10-CM | POA: Diagnosis not present

## 2017-03-08 DIAGNOSIS — E1140 Type 2 diabetes mellitus with diabetic neuropathy, unspecified: Secondary | ICD-10-CM | POA: Diagnosis not present

## 2017-03-08 DIAGNOSIS — G4733 Obstructive sleep apnea (adult) (pediatric): Secondary | ICD-10-CM | POA: Diagnosis not present

## 2017-03-08 DIAGNOSIS — F419 Anxiety disorder, unspecified: Secondary | ICD-10-CM | POA: Diagnosis not present

## 2017-03-08 DIAGNOSIS — I10 Essential (primary) hypertension: Secondary | ICD-10-CM | POA: Diagnosis not present

## 2017-03-08 DIAGNOSIS — E785 Hyperlipidemia, unspecified: Secondary | ICD-10-CM | POA: Diagnosis not present

## 2017-03-08 DIAGNOSIS — M545 Low back pain: Secondary | ICD-10-CM | POA: Diagnosis not present

## 2017-03-08 DIAGNOSIS — R569 Unspecified convulsions: Secondary | ICD-10-CM | POA: Diagnosis not present

## 2017-03-26 DIAGNOSIS — K219 Gastro-esophageal reflux disease without esophagitis: Secondary | ICD-10-CM | POA: Diagnosis not present

## 2017-03-26 DIAGNOSIS — I10 Essential (primary) hypertension: Secondary | ICD-10-CM | POA: Diagnosis not present

## 2017-03-26 DIAGNOSIS — R079 Chest pain, unspecified: Secondary | ICD-10-CM | POA: Diagnosis not present

## 2017-03-26 DIAGNOSIS — E782 Mixed hyperlipidemia: Secondary | ICD-10-CM | POA: Diagnosis not present

## 2017-03-26 DIAGNOSIS — E119 Type 2 diabetes mellitus without complications: Secondary | ICD-10-CM | POA: Diagnosis not present

## 2017-04-19 DIAGNOSIS — I10 Essential (primary) hypertension: Secondary | ICD-10-CM | POA: Diagnosis not present

## 2017-04-19 DIAGNOSIS — K219 Gastro-esophageal reflux disease without esophagitis: Secondary | ICD-10-CM | POA: Diagnosis not present

## 2017-04-19 DIAGNOSIS — E782 Mixed hyperlipidemia: Secondary | ICD-10-CM | POA: Diagnosis not present

## 2017-04-19 DIAGNOSIS — E119 Type 2 diabetes mellitus without complications: Secondary | ICD-10-CM | POA: Diagnosis not present

## 2017-04-19 DIAGNOSIS — Z6839 Body mass index (BMI) 39.0-39.9, adult: Secondary | ICD-10-CM | POA: Diagnosis not present

## 2017-05-06 DIAGNOSIS — G4733 Obstructive sleep apnea (adult) (pediatric): Secondary | ICD-10-CM | POA: Diagnosis not present

## 2017-05-06 DIAGNOSIS — M545 Low back pain: Secondary | ICD-10-CM | POA: Diagnosis not present

## 2017-05-06 DIAGNOSIS — F419 Anxiety disorder, unspecified: Secondary | ICD-10-CM | POA: Diagnosis not present

## 2017-05-06 DIAGNOSIS — G43701 Chronic migraine without aura, not intractable, with status migrainosus: Secondary | ICD-10-CM | POA: Diagnosis not present

## 2017-08-02 DIAGNOSIS — K219 Gastro-esophageal reflux disease without esophagitis: Secondary | ICD-10-CM | POA: Diagnosis not present

## 2017-08-02 DIAGNOSIS — E782 Mixed hyperlipidemia: Secondary | ICD-10-CM | POA: Diagnosis not present

## 2017-08-02 DIAGNOSIS — E119 Type 2 diabetes mellitus without complications: Secondary | ICD-10-CM | POA: Diagnosis not present

## 2017-08-02 DIAGNOSIS — Z6839 Body mass index (BMI) 39.0-39.9, adult: Secondary | ICD-10-CM | POA: Diagnosis not present

## 2017-08-02 DIAGNOSIS — I10 Essential (primary) hypertension: Secondary | ICD-10-CM | POA: Diagnosis not present

## 2017-08-03 DIAGNOSIS — G4733 Obstructive sleep apnea (adult) (pediatric): Secondary | ICD-10-CM | POA: Diagnosis not present

## 2017-08-03 DIAGNOSIS — M545 Low back pain: Secondary | ICD-10-CM | POA: Diagnosis not present

## 2017-08-03 DIAGNOSIS — G4700 Insomnia, unspecified: Secondary | ICD-10-CM | POA: Diagnosis not present

## 2017-08-03 DIAGNOSIS — R569 Unspecified convulsions: Secondary | ICD-10-CM | POA: Diagnosis not present

## 2017-10-31 DIAGNOSIS — R569 Unspecified convulsions: Secondary | ICD-10-CM | POA: Diagnosis not present

## 2017-10-31 DIAGNOSIS — M545 Low back pain: Secondary | ICD-10-CM | POA: Diagnosis not present

## 2017-10-31 DIAGNOSIS — G4700 Insomnia, unspecified: Secondary | ICD-10-CM | POA: Diagnosis not present

## 2017-10-31 DIAGNOSIS — G4733 Obstructive sleep apnea (adult) (pediatric): Secondary | ICD-10-CM | POA: Diagnosis not present

## 2017-11-30 DIAGNOSIS — I10 Essential (primary) hypertension: Secondary | ICD-10-CM | POA: Diagnosis not present

## 2017-11-30 DIAGNOSIS — M25561 Pain in right knee: Secondary | ICD-10-CM | POA: Diagnosis not present

## 2017-11-30 DIAGNOSIS — E782 Mixed hyperlipidemia: Secondary | ICD-10-CM | POA: Diagnosis not present

## 2017-11-30 DIAGNOSIS — Z6839 Body mass index (BMI) 39.0-39.9, adult: Secondary | ICD-10-CM | POA: Diagnosis not present

## 2017-11-30 DIAGNOSIS — M25562 Pain in left knee: Secondary | ICD-10-CM | POA: Diagnosis not present

## 2017-11-30 DIAGNOSIS — Z1389 Encounter for screening for other disorder: Secondary | ICD-10-CM | POA: Diagnosis not present

## 2017-11-30 DIAGNOSIS — Z6838 Body mass index (BMI) 38.0-38.9, adult: Secondary | ICD-10-CM | POA: Diagnosis not present

## 2017-11-30 DIAGNOSIS — K219 Gastro-esophageal reflux disease without esophagitis: Secondary | ICD-10-CM | POA: Diagnosis not present

## 2017-11-30 DIAGNOSIS — Z1331 Encounter for screening for depression: Secondary | ICD-10-CM | POA: Diagnosis not present

## 2017-11-30 DIAGNOSIS — E119 Type 2 diabetes mellitus without complications: Secondary | ICD-10-CM | POA: Diagnosis not present

## 2017-11-30 DIAGNOSIS — Z0001 Encounter for general adult medical examination with abnormal findings: Secondary | ICD-10-CM | POA: Diagnosis not present

## 2017-12-12 DIAGNOSIS — M545 Low back pain: Secondary | ICD-10-CM | POA: Diagnosis not present

## 2017-12-12 DIAGNOSIS — G4700 Insomnia, unspecified: Secondary | ICD-10-CM | POA: Diagnosis not present

## 2017-12-12 DIAGNOSIS — G4733 Obstructive sleep apnea (adult) (pediatric): Secondary | ICD-10-CM | POA: Diagnosis not present

## 2017-12-12 DIAGNOSIS — R569 Unspecified convulsions: Secondary | ICD-10-CM | POA: Diagnosis not present

## 2017-12-26 DIAGNOSIS — M1711 Unilateral primary osteoarthritis, right knee: Secondary | ICD-10-CM | POA: Diagnosis not present

## 2017-12-26 DIAGNOSIS — M1712 Unilateral primary osteoarthritis, left knee: Secondary | ICD-10-CM | POA: Diagnosis not present

## 2017-12-26 DIAGNOSIS — M170 Bilateral primary osteoarthritis of knee: Secondary | ICD-10-CM | POA: Diagnosis not present

## 2018-01-11 ENCOUNTER — Other Ambulatory Visit (HOSPITAL_COMMUNITY): Payer: Self-pay | Admitting: Neurology

## 2018-01-11 ENCOUNTER — Ambulatory Visit (HOSPITAL_COMMUNITY)
Admission: RE | Admit: 2018-01-11 | Discharge: 2018-01-11 | Disposition: A | Discharge status: Home / Self Care | Payer: PPO | Source: Ambulatory Visit | Attending: Neurology | Admitting: Neurology

## 2018-01-11 DIAGNOSIS — G4733 Obstructive sleep apnea (adult) (pediatric): Secondary | ICD-10-CM | POA: Diagnosis not present

## 2018-01-11 DIAGNOSIS — M545 Low back pain: Secondary | ICD-10-CM

## 2018-01-11 DIAGNOSIS — R569 Unspecified convulsions: Secondary | ICD-10-CM | POA: Diagnosis not present

## 2018-01-11 DIAGNOSIS — G4700 Insomnia, unspecified: Secondary | ICD-10-CM | POA: Diagnosis not present

## 2018-01-11 DIAGNOSIS — M4854XA Collapsed vertebra, not elsewhere classified, thoracic region, initial encounter for fracture: Secondary | ICD-10-CM | POA: Insufficient documentation

## 2018-01-11 DIAGNOSIS — M454 Ankylosing spondylitis of thoracic region: Secondary | ICD-10-CM | POA: Insufficient documentation

## 2018-01-11 DIAGNOSIS — M5136 Other intervertebral disc degeneration, lumbar region: Secondary | ICD-10-CM | POA: Diagnosis not present

## 2018-02-09 DIAGNOSIS — R42 Dizziness and giddiness: Secondary | ICD-10-CM | POA: Diagnosis not present

## 2018-02-09 DIAGNOSIS — R2689 Other abnormalities of gait and mobility: Secondary | ICD-10-CM | POA: Diagnosis not present

## 2018-02-09 DIAGNOSIS — H9319 Tinnitus, unspecified ear: Secondary | ICD-10-CM | POA: Diagnosis not present

## 2018-02-09 DIAGNOSIS — M25569 Pain in unspecified knee: Secondary | ICD-10-CM | POA: Diagnosis not present

## 2018-02-10 DIAGNOSIS — E119 Type 2 diabetes mellitus without complications: Secondary | ICD-10-CM | POA: Diagnosis not present

## 2018-02-10 DIAGNOSIS — M25562 Pain in left knee: Secondary | ICD-10-CM | POA: Diagnosis not present

## 2018-02-10 DIAGNOSIS — I10 Essential (primary) hypertension: Secondary | ICD-10-CM | POA: Diagnosis not present

## 2018-02-10 DIAGNOSIS — K219 Gastro-esophageal reflux disease without esophagitis: Secondary | ICD-10-CM | POA: Diagnosis not present

## 2018-02-10 DIAGNOSIS — Z6839 Body mass index (BMI) 39.0-39.9, adult: Secondary | ICD-10-CM | POA: Diagnosis not present

## 2018-02-10 DIAGNOSIS — M25561 Pain in right knee: Secondary | ICD-10-CM | POA: Diagnosis not present

## 2018-02-10 DIAGNOSIS — E782 Mixed hyperlipidemia: Secondary | ICD-10-CM | POA: Diagnosis not present

## 2018-04-12 DIAGNOSIS — H9319 Tinnitus, unspecified ear: Secondary | ICD-10-CM | POA: Diagnosis not present

## 2018-04-12 DIAGNOSIS — R42 Dizziness and giddiness: Secondary | ICD-10-CM | POA: Diagnosis not present

## 2018-04-12 DIAGNOSIS — M25569 Pain in unspecified knee: Secondary | ICD-10-CM | POA: Diagnosis not present

## 2018-04-12 DIAGNOSIS — R2689 Other abnormalities of gait and mobility: Secondary | ICD-10-CM | POA: Diagnosis not present

## 2018-04-13 DIAGNOSIS — M25562 Pain in left knee: Secondary | ICD-10-CM | POA: Diagnosis not present

## 2018-04-13 DIAGNOSIS — Z6839 Body mass index (BMI) 39.0-39.9, adult: Secondary | ICD-10-CM | POA: Diagnosis not present

## 2018-04-13 DIAGNOSIS — E119 Type 2 diabetes mellitus without complications: Secondary | ICD-10-CM | POA: Diagnosis not present

## 2018-04-13 DIAGNOSIS — M25561 Pain in right knee: Secondary | ICD-10-CM | POA: Diagnosis not present

## 2018-04-13 DIAGNOSIS — I10 Essential (primary) hypertension: Secondary | ICD-10-CM | POA: Diagnosis not present

## 2018-04-13 DIAGNOSIS — K219 Gastro-esophageal reflux disease without esophagitis: Secondary | ICD-10-CM | POA: Diagnosis not present

## 2018-04-13 DIAGNOSIS — E782 Mixed hyperlipidemia: Secondary | ICD-10-CM | POA: Diagnosis not present

## 2018-05-25 DIAGNOSIS — G4733 Obstructive sleep apnea (adult) (pediatric): Secondary | ICD-10-CM | POA: Diagnosis not present

## 2018-06-06 DIAGNOSIS — H9319 Tinnitus, unspecified ear: Secondary | ICD-10-CM | POA: Diagnosis not present

## 2018-06-06 DIAGNOSIS — M25569 Pain in unspecified knee: Secondary | ICD-10-CM | POA: Diagnosis not present

## 2018-06-06 DIAGNOSIS — R42 Dizziness and giddiness: Secondary | ICD-10-CM | POA: Diagnosis not present

## 2018-06-06 DIAGNOSIS — R2689 Other abnormalities of gait and mobility: Secondary | ICD-10-CM | POA: Diagnosis not present

## 2018-06-12 ENCOUNTER — Other Ambulatory Visit (HOSPITAL_COMMUNITY): Payer: Self-pay | Admitting: Pulmonary Disease

## 2018-06-12 ENCOUNTER — Ambulatory Visit (HOSPITAL_COMMUNITY)
Admission: RE | Admit: 2018-06-12 | Discharge: 2018-06-12 | Disposition: A | Discharge status: Home / Self Care | Payer: PPO | Source: Ambulatory Visit | Attending: Pulmonary Disease | Admitting: Pulmonary Disease

## 2018-06-12 DIAGNOSIS — G4733 Obstructive sleep apnea (adult) (pediatric): Secondary | ICD-10-CM | POA: Diagnosis not present

## 2018-06-12 DIAGNOSIS — R918 Other nonspecific abnormal finding of lung field: Secondary | ICD-10-CM | POA: Diagnosis not present

## 2018-06-12 DIAGNOSIS — J449 Chronic obstructive pulmonary disease, unspecified: Secondary | ICD-10-CM | POA: Diagnosis not present

## 2018-06-12 DIAGNOSIS — R0602 Shortness of breath: Secondary | ICD-10-CM | POA: Insufficient documentation

## 2018-06-12 DIAGNOSIS — E119 Type 2 diabetes mellitus without complications: Secondary | ICD-10-CM | POA: Diagnosis not present

## 2018-06-12 DIAGNOSIS — I10 Essential (primary) hypertension: Secondary | ICD-10-CM | POA: Diagnosis not present

## 2018-06-13 ENCOUNTER — Other Ambulatory Visit (HOSPITAL_COMMUNITY): Payer: Self-pay | Admitting: Respiratory Therapy

## 2018-06-13 DIAGNOSIS — R0602 Shortness of breath: Secondary | ICD-10-CM

## 2018-06-28 ENCOUNTER — Ambulatory Visit (HOSPITAL_COMMUNITY)
Admission: RE | Admit: 2018-06-28 | Discharge: 2018-06-28 | Disposition: A | Discharge status: Home / Self Care | Payer: PPO | Source: Ambulatory Visit | Attending: Pulmonary Disease | Admitting: Pulmonary Disease

## 2018-06-28 DIAGNOSIS — R0602 Shortness of breath: Secondary | ICD-10-CM | POA: Diagnosis not present

## 2018-06-28 LAB — PULMONARY FUNCTION TEST
DL/VA % pred: 98 %
DL/VA: 4.67 ml/min/mmHg/L
DLCO UNC % PRED: 93 %
DLCO UNC: 32.68 ml/min/mmHg
FEF 25-75 PRE: 2.64 L/s
FEF 25-75 Post: 3.03 L/sec
FEF2575-%Change-Post: 14 %
FEF2575-%PRED-POST: 92 %
FEF2575-%Pred-Pre: 80 %
FEV1-%Change-Post: 4 %
FEV1-%PRED-POST: 92 %
FEV1-%Pred-Pre: 88 %
FEV1-POST: 3.64 L
FEV1-Pre: 3.48 L
FEV1FVC-%Change-Post: 1 %
FEV1FVC-%Pred-Pre: 97 %
FEV6-%CHANGE-POST: 2 %
FEV6-%PRED-POST: 95 %
FEV6-%Pred-Pre: 93 %
FEV6-PRE: 4.65 L
FEV6-Post: 4.75 L
FEV6FVC-%CHANGE-POST: 0 %
FEV6FVC-%PRED-PRE: 103 %
FEV6FVC-%Pred-Post: 102 %
FVC-%Change-Post: 2 %
FVC-%PRED-POST: 93 %
FVC-%Pred-Pre: 91 %
FVC-Post: 4.82 L
FVC-Pre: 4.71 L
POST FEV1/FVC RATIO: 75 %
PRE FEV1/FVC RATIO: 74 %
Post FEV6/FVC ratio: 99 %
Pre FEV6/FVC Ratio: 99 %
RV % PRED: 160 %
RV: 3.7 L
TLC % pred: 116 %
TLC: 8.61 L

## 2018-06-28 MED ORDER — ALBUTEROL SULFATE (2.5 MG/3ML) 0.083% IN NEBU
2.5000 mg | INHALATION_SOLUTION | Freq: Once | RESPIRATORY_TRACT | Status: AC
  Administered 2018-06-28: 2.5 mg via RESPIRATORY_TRACT

## 2018-07-31 ENCOUNTER — Other Ambulatory Visit (HOSPITAL_COMMUNITY): Payer: Self-pay | Admitting: Pulmonary Disease

## 2018-07-31 ENCOUNTER — Other Ambulatory Visit: Payer: Self-pay | Admitting: Pulmonary Disease

## 2018-07-31 DIAGNOSIS — R0602 Shortness of breath: Secondary | ICD-10-CM | POA: Diagnosis not present

## 2018-07-31 DIAGNOSIS — M79605 Pain in left leg: Secondary | ICD-10-CM

## 2018-07-31 DIAGNOSIS — I10 Essential (primary) hypertension: Secondary | ICD-10-CM | POA: Diagnosis not present

## 2018-07-31 DIAGNOSIS — J449 Chronic obstructive pulmonary disease, unspecified: Secondary | ICD-10-CM | POA: Diagnosis not present

## 2018-07-31 DIAGNOSIS — E119 Type 2 diabetes mellitus without complications: Secondary | ICD-10-CM | POA: Diagnosis not present

## 2018-08-01 ENCOUNTER — Other Ambulatory Visit (HOSPITAL_COMMUNITY)
Admission: RE | Admit: 2018-08-01 | Discharge: 2018-08-01 | Disposition: A | Discharge status: Home / Self Care | Payer: PPO | Source: Ambulatory Visit | Attending: Pulmonary Disease | Admitting: Pulmonary Disease

## 2018-08-01 DIAGNOSIS — R609 Edema, unspecified: Secondary | ICD-10-CM | POA: Insufficient documentation

## 2018-08-01 DIAGNOSIS — R2689 Other abnormalities of gait and mobility: Secondary | ICD-10-CM | POA: Diagnosis not present

## 2018-08-01 DIAGNOSIS — Z79899 Other long term (current) drug therapy: Secondary | ICD-10-CM | POA: Diagnosis not present

## 2018-08-01 DIAGNOSIS — R42 Dizziness and giddiness: Secondary | ICD-10-CM | POA: Diagnosis not present

## 2018-08-01 DIAGNOSIS — H9319 Tinnitus, unspecified ear: Secondary | ICD-10-CM | POA: Diagnosis not present

## 2018-08-01 DIAGNOSIS — M25569 Pain in unspecified knee: Secondary | ICD-10-CM | POA: Diagnosis not present

## 2018-08-01 LAB — CBC
HEMATOCRIT: 42.2 % (ref 39.0–52.0)
Hemoglobin: 12.2 g/dL — ABNORMAL LOW (ref 13.0–17.0)
MCH: 21.7 pg — AB (ref 26.0–34.0)
MCHC: 28.9 g/dL — ABNORMAL LOW (ref 30.0–36.0)
MCV: 75.2 fL — AB (ref 80.0–100.0)
NRBC: 0 % (ref 0.0–0.2)
Platelets: 418 10*3/uL — ABNORMAL HIGH (ref 150–400)
RBC: 5.61 MIL/uL (ref 4.22–5.81)
RDW: 18 % — ABNORMAL HIGH (ref 11.5–15.5)
WBC: 8.5 10*3/uL (ref 4.0–10.5)

## 2018-08-01 LAB — COMPREHENSIVE METABOLIC PANEL
ALK PHOS: 77 U/L (ref 38–126)
ALT: 26 U/L (ref 0–44)
AST: 35 U/L (ref 15–41)
Albumin: 4.1 g/dL (ref 3.5–5.0)
Anion gap: 11 (ref 5–15)
BILIRUBIN TOTAL: 0.7 mg/dL (ref 0.3–1.2)
BUN: 23 mg/dL — AB (ref 6–20)
CALCIUM: 10 mg/dL (ref 8.9–10.3)
CO2: 24 mmol/L (ref 22–32)
CREATININE: 1.17 mg/dL (ref 0.61–1.24)
Chloride: 103 mmol/L (ref 98–111)
Glucose, Bld: 162 mg/dL — ABNORMAL HIGH (ref 70–99)
Potassium: 3.8 mmol/L (ref 3.5–5.1)
Sodium: 138 mmol/L (ref 135–145)
TOTAL PROTEIN: 8.2 g/dL — AB (ref 6.5–8.1)

## 2018-08-01 LAB — SEDIMENTATION RATE: Sed Rate: 18 mm/hr — ABNORMAL HIGH (ref 0–16)

## 2018-08-01 LAB — TSH: TSH: 0.822 u[IU]/mL (ref 0.350–4.500)

## 2018-08-02 ENCOUNTER — Other Ambulatory Visit (HOSPITAL_COMMUNITY): Payer: Self-pay | Admitting: Pulmonary Disease

## 2018-08-02 DIAGNOSIS — R0602 Shortness of breath: Secondary | ICD-10-CM

## 2018-08-03 ENCOUNTER — Ambulatory Visit (HOSPITAL_BASED_OUTPATIENT_CLINIC_OR_DEPARTMENT_OTHER)
Admission: RE | Admit: 2018-08-03 | Discharge: 2018-08-03 | Disposition: A | Discharge status: Home / Self Care | Payer: PPO | Source: Ambulatory Visit | Attending: Family Medicine | Admitting: Family Medicine

## 2018-08-03 ENCOUNTER — Ambulatory Visit (HOSPITAL_COMMUNITY)
Admission: RE | Admit: 2018-08-03 | Discharge: 2018-08-03 | Disposition: A | Discharge status: Home / Self Care | Payer: PPO | Source: Ambulatory Visit | Attending: Pulmonary Disease | Admitting: Pulmonary Disease

## 2018-08-03 DIAGNOSIS — Z6838 Body mass index (BMI) 38.0-38.9, adult: Secondary | ICD-10-CM | POA: Diagnosis not present

## 2018-08-03 DIAGNOSIS — M79605 Pain in left leg: Secondary | ICD-10-CM | POA: Diagnosis not present

## 2018-08-03 DIAGNOSIS — I10 Essential (primary) hypertension: Secondary | ICD-10-CM | POA: Insufficient documentation

## 2018-08-03 DIAGNOSIS — E119 Type 2 diabetes mellitus without complications: Secondary | ICD-10-CM | POA: Diagnosis not present

## 2018-08-03 DIAGNOSIS — E669 Obesity, unspecified: Secondary | ICD-10-CM | POA: Diagnosis not present

## 2018-08-03 DIAGNOSIS — R600 Localized edema: Secondary | ICD-10-CM | POA: Diagnosis not present

## 2018-08-03 DIAGNOSIS — R0602 Shortness of breath: Secondary | ICD-10-CM | POA: Diagnosis not present

## 2018-08-03 DIAGNOSIS — E785 Hyperlipidemia, unspecified: Secondary | ICD-10-CM | POA: Insufficient documentation

## 2018-08-03 NOTE — Progress Notes (Signed)
*  PRELIMINARY RESULTS* Echocardiogram 2D Echocardiogram has been performed.  Stacey Drain 08/03/2018, 1:50 PM

## 2018-08-08 DIAGNOSIS — I10 Essential (primary) hypertension: Secondary | ICD-10-CM | POA: Diagnosis not present

## 2018-08-08 DIAGNOSIS — E782 Mixed hyperlipidemia: Secondary | ICD-10-CM | POA: Diagnosis not present

## 2018-08-08 DIAGNOSIS — K219 Gastro-esophageal reflux disease without esophagitis: Secondary | ICD-10-CM | POA: Diagnosis not present

## 2018-08-08 DIAGNOSIS — M25562 Pain in left knee: Secondary | ICD-10-CM | POA: Diagnosis not present

## 2018-08-08 DIAGNOSIS — E119 Type 2 diabetes mellitus without complications: Secondary | ICD-10-CM | POA: Diagnosis not present

## 2018-08-08 DIAGNOSIS — M25561 Pain in right knee: Secondary | ICD-10-CM | POA: Diagnosis not present

## 2018-08-08 DIAGNOSIS — Z6839 Body mass index (BMI) 39.0-39.9, adult: Secondary | ICD-10-CM | POA: Diagnosis not present

## 2018-08-21 ENCOUNTER — Encounter: Payer: Self-pay | Admitting: *Deleted

## 2018-08-22 ENCOUNTER — Ambulatory Visit: Payer: PPO | Admitting: Cardiology

## 2018-08-22 ENCOUNTER — Encounter: Payer: Self-pay | Admitting: Cardiology

## 2018-08-22 ENCOUNTER — Other Ambulatory Visit (HOSPITAL_COMMUNITY)
Admission: RE | Admit: 2018-08-22 | Discharge: 2018-08-22 | Disposition: A | Discharge status: Home / Self Care | Payer: PPO | Source: Ambulatory Visit | Attending: Cardiology | Admitting: Cardiology

## 2018-08-22 VITALS — BP 110/72 | HR 85 | Ht 72.0 in | Wt 290.2 lb

## 2018-08-22 DIAGNOSIS — R0609 Other forms of dyspnea: Secondary | ICD-10-CM

## 2018-08-22 DIAGNOSIS — R0600 Dyspnea, unspecified: Secondary | ICD-10-CM

## 2018-08-22 DIAGNOSIS — R079 Chest pain, unspecified: Secondary | ICD-10-CM

## 2018-08-22 LAB — BASIC METABOLIC PANEL
Anion gap: 9 (ref 5–15)
BUN: 19 mg/dL (ref 6–20)
CO2: 25 mmol/L (ref 22–32)
Calcium: 9.3 mg/dL (ref 8.9–10.3)
Chloride: 104 mmol/L (ref 98–111)
Creatinine, Ser: 1.08 mg/dL (ref 0.61–1.24)
GFR calc Af Amer: >60 mL/min (ref >60)
GFR calc non Af Amer: >60 mL/min (ref >60)
Glucose, Bld: 204 mg/dL — ABNORMAL HIGH (ref 70–99)
POTASSIUM: 4 mmol/L (ref 3.5–5.1)
Sodium: 138 mmol/L (ref 135–145)

## 2018-08-22 LAB — MAGNESIUM: Magnesium: 2 mg/dL (ref 1.7–2.4)

## 2018-08-22 MED ORDER — FUROSEMIDE 40 MG PO TABS: 40.0000 mg | ORAL_TABLET | Freq: Every day | ORAL | 1 refills | Status: DC

## 2018-08-22 NOTE — Progress Notes (Signed)
Clinical Summary Xavier Williams is a 58 y.o.male seen as new consult, referred by Dr Xavier Williams for SOB.   1. SOB - from pcp notes PFTs were ok. Symptoms thought out of proportion to his lung disease according to pulmonary Jan 2020 echo LVEF 60-65%, no WMAs, grade I diastolic dysfunction Jan 2020 LLE [place] no DVT   - SOB for a few years, gradually worsening - DOE with activities, DOE just walking in from parking lot -mild cough, + wheezing. No LE edema. Chronically sleeps at angle    2. Chest pain - sharp pain midchest, can occur at rest. Can get diaphoretic, nauseous. Lasts a few seconds, about 1-2 episodes per month - remote cath several years. Remote stress tests.   CAD risk factors; DM2, HTN, HL, former tobacco x 30 years.    3. COPD - followed by Dr Xavier Williams.   Past Medical History:  Diagnosis Date  . Chronic back pain   . DDD (degenerative disc disease), lumbar   . Depression   . Diabetes mellitus   . Hyperlipidemia   . Hypertension   . Kidney stones   . Schizophrenia (HCC)   . Sleep apnea      Allergies  Allergen Reactions  . Codeine   . Morphine And Related Nausea And Vomiting     Current Outpatient Medications  Medication Sig Dispense Refill  . allopurinol (ZYLOPRIM) 100 MG tablet Take 100 mg by mouth daily.     Marland Kitchen aspirin EC 81 MG tablet Take 81 mg by mouth every morning.    Marland Kitchen aspirin-acetaminophen-caffeine (EXCEDRIN MIGRAINE) 250-250-65 MG tablet Take 2 tablets by mouth every 6 (six) hours as needed for headache.    Marland Kitchen atenolol (TENORMIN) 25 MG tablet Take 25 mg by mouth daily.      Marland Kitchen atorvastatin (LIPITOR) 40 MG tablet Take 40 mg by mouth daily.    . ciprofloxacin (CIPRO) 500 MG tablet Take 1 tablet (500 mg total) by mouth 2 (two) times daily. One po bid x 7 days (Patient not taking: Reported on 12/11/2015) 28 tablet 0  . DULoxetine (CYMBALTA) 60 MG capsule Take 60 mg by mouth daily.     Marland Kitchen esomeprazole (NEXIUM) 40 MG capsule Take 40 mg by mouth every  morning.     Marland Kitchen glimepiride (AMARYL) 2 MG tablet Take 1 mg by mouth every morning.     . hydrochlorothiazide 25 MG tablet Take 12.5 mg by mouth every morning.     Marland Kitchen HYDROcodone-acetaminophen (NORCO/VICODIN) 5-325 MG tablet Take 1 tablet by mouth every 6 (six) hours as needed. 20 tablet 0  . lisinopril (PRINIVIL,ZESTRIL) 10 MG tablet Take 5 mg by mouth every morning.    . meloxicam (MOBIC) 15 MG tablet Take 15 mg by mouth daily.     . metFORMIN (GLUCOPHAGE) 1000 MG tablet Take 1,000 mg by mouth 2 (two) times daily with a meal.      . Omega-3 Fatty Acids (FISH OIL PO) Take 1 capsule by mouth 2 (two) times daily.    . polyethylene glycol powder (GLYCOLAX/MIRALAX) powder Take 17 g by mouth daily.    . potassium citrate (UROCIT-K) 10 MEQ (1080 MG) SR tablet Take 10 mEq by mouth daily.     . tamsulosin (FLOMAX) 0.4 MG CAPS capsule Take 0.4 mg by mouth daily.      No current facility-administered medications for this visit.      Past Surgical History:  Procedure Laterality Date  . CARDIAC CATHETERIZATION  2005  normal coronary arteries  . COLONOSCOPY       Allergies  Allergen Reactions  . Codeine   . Morphine And Related Nausea And Vomiting      Family History  Problem Relation Age of Onset  . Heart attack Mother      Social History Mr. Xavier Williams reports that he has quit smoking. He does not have any smokeless tobacco history on file. Mr. Xavier Williams reports no history of alcohol use.   Review of Systems CONSTITUTIONAL: No weight loss, fever, chills, weakness or fatigue.  HEENT: Eyes: No visual loss, blurred vision, double vision or yellow sclerae.No hearing loss, sneezing, congestion, runny nose or sore throat.  SKIN: No rash or itching.  CARDIOVASCULAR: per hpi RESPIRATORY: per hpi GASTROINTESTINAL: No anorexia, nausea, vomiting or diarrhea. No abdominal pain or blood.  GENITOURINARY: No burning on urination, no polyuria NEUROLOGICAL: No headache, dizziness, syncope, paralysis,  ataxia, numbness or tingling in the extremities. No change in bowel or bladder control.  MUSCULOSKELETAL: No muscle, back pain, joint pain or stiffness.  LYMPHATICS: No enlarged nodes. No history of splenectomy.  PSYCHIATRIC: No history of depression or anxiety.  ENDOCRINOLOGIC: No reports of sweating, cold or heat intolerance. No polyuria or polydipsia.  Marland Kitchen.   Physical Examination Vitals:   08/22/18 0854 08/22/18 0901  BP: 103/70 110/72  Pulse: 94 85  SpO2: 97% 96%   Vitals:   08/22/18 0854  Weight: 290 lb 3.2 oz (131.6 kg)  Height: 6' (1.829 m)    Gen: resting comfortably, no acute distress HEENT: no scleral icterus, pupils equal round and reactive, no palptable cervical adenopathy,  CV: RRR, no m/r/g, no jvd Resp: Clear to auscultation bilaterally GI: abdomen is soft, non-tender, non-distended, normal bowel sounds, no hepatosplenomegaly MSK: extremities are warm, no edema.  Skin: warm, no rash Neuro:  no focal deficits Psych: appropriate affect     Assessment and Plan  1. SOB/Chest pain - from pulmonary evaluation symptoms out of proportion to his lung disease - echo with mild diastolic dysfunction. Exam limited by body habitus, no clear fluid overload - will try changing his HCTZ to lasix 40mg  daily for more potent diuretic, check BMET in 2 weeks - given multiple CAD risk factors along with chest pain DOE plan for coronary CTA, based on body habitus I think nuclear imaging and echo would be limited        Xavier PocheJonathan F. , M.D.

## 2018-08-22 NOTE — Patient Instructions (Addendum)
Your physician recommends that you schedule a follow-up appointment in: 2 MONTHS [facility]  Your physician has recommended you make the following change in your medication:   STOP HCTZ  START LASIX 40 MG DAILY   Your physician recommends that you return for lab work in: 2 WEEKS BMP/MG  [facility] at xx:xx AM (30-45 minutes prior to test start time)  [facility] [address] [place], [place] [address] [phone]  Proceed to the [facility] Radiology Department (First Floor).  Please follow these instructions carefully (unless otherwise directed):  Hold all erectile dysfunction medications at least 48 hours prior to test.  On the Night Before the Test: . Be sure to Drink plenty of water. . Do not consume any caffeinated/decaffeinated beverages or chocolate 12 hours prior to your test. . Do not take any antihistamines 12 hours prior to your test. . If you take Metformin do not take 24 hours prior to test.  On the Day of the Test: . Drink plenty of water. Do not drink any water within one hour of the test. . Do not eat any food 4 hours prior to the test. . You may take your regular medications prior to the test.  . HOLD Furosemide/Hydrochlorothiazide morning of the test.        -Drink plenty of water       After the Test: . Drink plenty of water. . After receiving IV contrast, you may experience a mild flushed feeling. This is normal. . On occasion, you may experience a mild rash up to 24 hours after the test. This is not dangerous. If this occurs, you can take Benadryl 25 mg and increase your fluid intake. . If you experience trouble breathing, this can be serious. If it is severe call 911 IMMEDIATELY. If it is mild, please call our office. . If you take any of these medications: Glipizide/Metformin, Avandament, Glucavance, please do not take 48 hours after completing test.

## 2018-08-29 ENCOUNTER — Telehealth: Payer: Self-pay | Admitting: Cardiology

## 2018-08-29 ENCOUNTER — Telehealth: Payer: Self-pay | Admitting: *Deleted

## 2018-08-29 NOTE — Telephone Encounter (Signed)
Pt voiced understanding - updated mediation list

## 2018-08-29 NOTE — Telephone Encounter (Signed)
Pt aware - routed to pcp  

## 2018-08-29 NOTE — Telephone Encounter (Signed)
Doesn't know what HR BP has been

## 2018-08-29 NOTE — Telephone Encounter (Signed)
Patient walk in   Furosemide causing severe dizziness, nausea,  and vomiting  Started with symptoms this past Sunday

## 2018-08-29 NOTE — Telephone Encounter (Signed)
Last 2 days pt has been dizzy/nauseated that has gotten worse since starting lasix - says he stopped this and re started hctz this morning

## 2018-08-29 NOTE — Telephone Encounter (Signed)
-----   Message from Antoine Poche, MD sent at 08/28/2018  9:31 AM EST ----- Labs look good   Dominga Ferry MD

## 2018-08-29 NOTE — Telephone Encounter (Signed)
Ok to go back to his HCTZ, I believe he was on 12.5mg  daily. Can keep lasix on as needed basis for leg swelling or significant weight gain   Dominga Ferry MD

## 2018-09-06 ENCOUNTER — Telehealth (HOSPITAL_COMMUNITY): Payer: Self-pay | Admitting: Emergency Medicine

## 2018-09-06 NOTE — Telephone Encounter (Signed)
Left message on voicemail with name and callback number Carliss Porcaro RN Navigator Cardiac [facility] [phone] Office [phone] Cell  

## 2018-09-07 ENCOUNTER — Telehealth (HOSPITAL_COMMUNITY): Payer: Self-pay | Admitting: Emergency Medicine

## 2018-09-07 NOTE — Telephone Encounter (Signed)
Pt returning phone call. pt verbalizes understanding of appt date/time, parking situation and where to check in, pre-test NPO status and medications ordered, and verified current allergies; name and call back number provided for further questions should they arise Sherise Geerdes RN Navigator [facility] [phone] office [phone] cell 

## 2018-09-08 ENCOUNTER — Ambulatory Visit (HOSPITAL_COMMUNITY)
Admission: RE | Admit: 2018-09-08 | Discharge: 2018-09-08 | Disposition: A | Discharge status: Home / Self Care | Payer: PPO | Source: Ambulatory Visit | Attending: Cardiology | Admitting: Cardiology

## 2018-09-08 ENCOUNTER — Ambulatory Visit (HOSPITAL_COMMUNITY): Admission: RE | Admit: 2018-09-08 | Payer: PPO | Source: Ambulatory Visit

## 2018-09-08 DIAGNOSIS — R079 Chest pain, unspecified: Secondary | ICD-10-CM | POA: Diagnosis not present

## 2018-09-08 DIAGNOSIS — R0600 Dyspnea, unspecified: Secondary | ICD-10-CM

## 2018-09-08 DIAGNOSIS — R0609 Other forms of dyspnea: Secondary | ICD-10-CM | POA: Insufficient documentation

## 2018-09-08 MED ORDER — NITROGLYCERIN 0.4 MG SL SUBL
0.8000 mg | SUBLINGUAL_TABLET | Freq: Once | SUBLINGUAL | Status: AC
  Administered 2018-09-08: 0.8 mg via SUBLINGUAL
  Filled 2018-09-08: qty 25

## 2018-09-08 MED ORDER — IOPAMIDOL (ISOVUE-370) INJECTION 76%
80.0000 mL | Freq: Once | INTRAVENOUS | Status: AC | PRN
  Administered 2018-09-08: 80 mL via INTRAVENOUS

## 2018-09-08 MED ORDER — NITROGLYCERIN 0.4 MG SL SUBL
SUBLINGUAL_TABLET | SUBLINGUAL | Status: AC
  Administered 2018-09-08: 0.8 mg via SUBLINGUAL
  Filled 2018-09-08: qty 2

## 2018-09-08 MED ORDER — METOPROLOL TARTRATE 5 MG/5ML IV SOLN
5.0000 mg | INTRAVENOUS | Status: DC | PRN
  Administered 2018-09-08: 5 mg via INTRAVENOUS
  Filled 2018-09-08 (×2): qty 5

## 2018-09-08 MED ORDER — METOPROLOL TARTRATE 5 MG/5ML IV SOLN
INTRAVENOUS | Status: AC
  Administered 2018-09-08: 5 mg via INTRAVENOUS
  Filled 2018-09-08: qty 15

## 2018-09-12 ENCOUNTER — Telehealth: Payer: Self-pay | Admitting: Cardiology

## 2018-09-12 NOTE — Telephone Encounter (Signed)
Patient informed that results have not been reviewed by his provider yet and once the ct is reviewed, he would be contacted with the result. Verbalized understanding.

## 2018-09-12 NOTE — Telephone Encounter (Signed)
Patient called requesting test results of recent CT. °

## 2018-09-13 ENCOUNTER — Telehealth: Payer: Self-pay | Admitting: *Deleted

## 2018-09-13 NOTE — Telephone Encounter (Signed)
Pt aware - routed to pcp  

## 2018-09-13 NOTE — Telephone Encounter (Signed)
-----   Message from Antoine Poche, MD sent at 09/12/2018  1:25 PM EST ----- Heart CT looks good, no significant blockages in the arteries of the heart  Dominga Ferry MD

## 2018-09-26 DIAGNOSIS — J449 Chronic obstructive pulmonary disease, unspecified: Secondary | ICD-10-CM | POA: Diagnosis not present

## 2018-09-26 DIAGNOSIS — E669 Obesity, unspecified: Secondary | ICD-10-CM | POA: Diagnosis not present

## 2018-09-26 DIAGNOSIS — R2689 Other abnormalities of gait and mobility: Secondary | ICD-10-CM | POA: Diagnosis not present

## 2018-09-26 DIAGNOSIS — I10 Essential (primary) hypertension: Secondary | ICD-10-CM | POA: Diagnosis not present

## 2018-09-26 DIAGNOSIS — R42 Dizziness and giddiness: Secondary | ICD-10-CM | POA: Diagnosis not present

## 2018-09-26 DIAGNOSIS — K210 Gastro-esophageal reflux disease with esophagitis: Secondary | ICD-10-CM | POA: Diagnosis not present

## 2018-09-26 DIAGNOSIS — H9319 Tinnitus, unspecified ear: Secondary | ICD-10-CM | POA: Diagnosis not present

## 2018-09-26 DIAGNOSIS — M25569 Pain in unspecified knee: Secondary | ICD-10-CM | POA: Diagnosis not present

## 2018-10-24 ENCOUNTER — Telehealth: Payer: Self-pay | Admitting: *Deleted

## 2018-10-24 NOTE — Telephone Encounter (Signed)
Spoke with pt about rescheduling 4/3 appt and says Dr Juanetta Gosling was supposed to get back with Dr Wyline Mood on pt o2 level. Do not see where Dr Juanetta Gosling contacted Dr Wyline Mood. Pt is concerned about 02 level dropping down in the 60s when all testing has came back normal.

## 2018-10-26 ENCOUNTER — Encounter: Payer: Self-pay | Admitting: *Deleted

## 2018-10-26 NOTE — Telephone Encounter (Signed)
Heart testing has been completely normal. Can we get records from Dr Juanetta Gosling and scan them in, can you contact me when scanned in. I would cancel his appt Friday, we will decide once we get Dr Juanetta Gosling notes about a possible visit next week.    Dominga Ferry MD

## 2018-10-26 NOTE — Telephone Encounter (Signed)
Requested notes from Dr Juanetta Gosling and spoke with pt. appt for tomorrow canceled. Office staff on site will let me know when notes are received

## 2018-10-27 ENCOUNTER — Ambulatory Visit: Payer: PPO | Admitting: Cardiology

## 2018-11-03 ENCOUNTER — Telehealth: Payer: Self-pay | Admitting: Cardiology

## 2018-11-03 NOTE — Telephone Encounter (Signed)
Mrs. Mcclarin called stating that patient continues to have shortness of breath. States that his body temp is running low.  95.6 (11/02/2018) 94.8 (11/02/2018)

## 2018-11-03 NOTE — Telephone Encounter (Signed)
Attempt to reach, line just rings. 

## 2018-11-06 NOTE — Telephone Encounter (Signed)
Per wife, she has spoken to Dr. Juanetta Gosling about this issue and says that Dr. Juanetta Gosling will be speaking with Dr. Wyline Mood about patient's thyroid.

## 2018-11-21 DIAGNOSIS — M545 Low back pain: Secondary | ICD-10-CM | POA: Diagnosis not present

## 2018-11-21 DIAGNOSIS — M25569 Pain in unspecified knee: Secondary | ICD-10-CM | POA: Diagnosis not present

## 2018-11-21 DIAGNOSIS — R2689 Other abnormalities of gait and mobility: Secondary | ICD-10-CM | POA: Diagnosis not present

## 2018-11-21 DIAGNOSIS — R42 Dizziness and giddiness: Secondary | ICD-10-CM | POA: Diagnosis not present

## 2018-11-21 DIAGNOSIS — H9319 Tinnitus, unspecified ear: Secondary | ICD-10-CM | POA: Diagnosis not present

## 2018-11-27 DIAGNOSIS — G4733 Obstructive sleep apnea (adult) (pediatric): Secondary | ICD-10-CM | POA: Diagnosis not present

## 2018-11-27 DIAGNOSIS — N401 Enlarged prostate with lower urinary tract symptoms: Secondary | ICD-10-CM | POA: Diagnosis not present

## 2018-11-27 DIAGNOSIS — J449 Chronic obstructive pulmonary disease, unspecified: Secondary | ICD-10-CM | POA: Diagnosis not present

## 2018-11-27 DIAGNOSIS — I10 Essential (primary) hypertension: Secondary | ICD-10-CM | POA: Diagnosis not present

## 2018-11-30 DIAGNOSIS — E119 Type 2 diabetes mellitus without complications: Secondary | ICD-10-CM | POA: Diagnosis not present

## 2018-12-01 DIAGNOSIS — E782 Mixed hyperlipidemia: Secondary | ICD-10-CM | POA: Diagnosis not present

## 2018-12-01 DIAGNOSIS — R42 Dizziness and giddiness: Secondary | ICD-10-CM | POA: Diagnosis not present

## 2018-12-01 DIAGNOSIS — Z79899 Other long term (current) drug therapy: Secondary | ICD-10-CM | POA: Diagnosis not present

## 2018-12-01 DIAGNOSIS — E119 Type 2 diabetes mellitus without complications: Secondary | ICD-10-CM | POA: Diagnosis not present

## 2018-12-01 DIAGNOSIS — E559 Vitamin D deficiency, unspecified: Secondary | ICD-10-CM | POA: Diagnosis not present

## 2018-12-01 LAB — HEMOGLOBIN A1C: Hemoglobin A1C: 8

## 2018-12-06 DIAGNOSIS — K5900 Constipation, unspecified: Secondary | ICD-10-CM | POA: Diagnosis not present

## 2018-12-07 DIAGNOSIS — K5901 Slow transit constipation: Secondary | ICD-10-CM | POA: Diagnosis not present

## 2018-12-14 DIAGNOSIS — K5901 Slow transit constipation: Secondary | ICD-10-CM | POA: Diagnosis not present

## 2018-12-14 DIAGNOSIS — E119 Type 2 diabetes mellitus without complications: Secondary | ICD-10-CM | POA: Diagnosis not present

## 2018-12-14 DIAGNOSIS — J449 Chronic obstructive pulmonary disease, unspecified: Secondary | ICD-10-CM | POA: Diagnosis not present

## 2018-12-14 DIAGNOSIS — E559 Vitamin D deficiency, unspecified: Secondary | ICD-10-CM | POA: Diagnosis not present

## 2018-12-20 DIAGNOSIS — F332 Major depressive disorder, recurrent severe without psychotic features: Secondary | ICD-10-CM | POA: Diagnosis not present

## 2018-12-20 DIAGNOSIS — F411 Generalized anxiety disorder: Secondary | ICD-10-CM | POA: Diagnosis not present

## 2018-12-20 DIAGNOSIS — F1011 Alcohol abuse, in remission: Secondary | ICD-10-CM | POA: Diagnosis not present

## 2018-12-20 DIAGNOSIS — F17201 Nicotine dependence, unspecified, in remission: Secondary | ICD-10-CM | POA: Diagnosis not present

## 2018-12-20 DIAGNOSIS — F4310 Post-traumatic stress disorder, unspecified: Secondary | ICD-10-CM | POA: Diagnosis not present

## 2018-12-20 DIAGNOSIS — Z634 Disappearance and death of family member: Secondary | ICD-10-CM | POA: Diagnosis not present

## 2018-12-26 ENCOUNTER — Other Ambulatory Visit: Payer: Self-pay

## 2018-12-26 ENCOUNTER — Encounter (INDEPENDENT_AMBULATORY_CARE_PROVIDER_SITE_OTHER): Payer: Self-pay

## 2018-12-26 ENCOUNTER — Encounter: Payer: Self-pay | Admitting: "Endocrinology

## 2018-12-26 ENCOUNTER — Ambulatory Visit (INDEPENDENT_AMBULATORY_CARE_PROVIDER_SITE_OTHER): Payer: PPO | Admitting: "Endocrinology

## 2018-12-26 VITALS — BP 111/75 | HR 104 | Temp 98.2°F | Ht 72.0 in | Wt 289.8 lb

## 2018-12-26 DIAGNOSIS — I10 Essential (primary) hypertension: Secondary | ICD-10-CM

## 2018-12-26 DIAGNOSIS — E1165 Type 2 diabetes mellitus with hyperglycemia: Secondary | ICD-10-CM

## 2018-12-26 DIAGNOSIS — E782 Mixed hyperlipidemia: Secondary | ICD-10-CM

## 2018-12-26 MED ORDER — ACCU-CHEK GUIDE ME W/DEVICE KIT: 1.0000 | PACK | 0 refills | Status: AC

## 2018-12-26 MED ORDER — GLUCOSE BLOOD VI STRP: ORAL_STRIP | 2 refills | Status: DC

## 2018-12-26 NOTE — Progress Notes (Signed)
Endocrinology Consult Note       12/26/2018, 5:16 PM   Subjective:    Patient ID: Xavier Williams, male    DOB: [birth year].  Xavier Williams is being seen in consultation for management of currently uncontrolled symptomatic diabetes requested by  [facility].   Past Medical History:  Diagnosis Date  . Chronic back pain   . DDD (degenerative disc disease), lumbar   . Depression   . Diabetes mellitus   . Hyperlipidemia   . Hypertension   . Kidney stones   . Schizophrenia ([place])   . Sleep apnea     Past Surgical History:  Procedure Laterality Date  . CARDIAC CATHETERIZATION  2005   normal coronary arteries  . COLONOSCOPY      Social History   Socioeconomic History  . Marital status: Married    Spouse name: Not on file  . Number of children: Not on file  . Years of education: Not on file  . Highest education level: Not on file  Occupational History  . Not on file  Social Needs  . Financial resource strain: Not on file  . Food insecurity:    Worry: Not on file    Inability: Not on file  . Transportation needs:    Medical: Not on file    Non-medical: Not on file  Tobacco Use  . Smoking status: Former Research scientist (life sciences)  . Smokeless tobacco: Former Systems developer    Types: Chew  Substance and Sexual Activity  . Alcohol use: No  . Drug use: No  . Sexual activity: Not on file  Lifestyle  . Physical activity:    Days per week: Not on file    Minutes per session: Not on file  . Stress: Not on file  Relationships  . Social connections:    Talks on phone: Not on file    Gets together: Not on file    Attends religious service: Not on file    Active member of club or organization: Not on file    Attends meetings of clubs or organizations: Not on file    Relationship status: Not on file  Other Topics Concern  . Not on file  Social History Narrative  . Not on file    Family History   Problem Relation Age of Onset  . Heart attack Mother     Outpatient Encounter Medications as of 12/26/2018  Medication Sig  . potassium citrate (UROCIT-K) 10 MEQ (1080 MG) SR tablet Take 10 mEq by mouth daily.   Marland Kitchen albuterol (PROVENTIL HFA;VENTOLIN HFA) 108 (90 Base) MCG/ACT inhaler Inhale 2 puffs into the lungs every 6 (six) hours as needed for wheezing or shortness of breath.  . allopurinol (ZYLOPRIM) 100 MG tablet Take 100 mg by mouth daily.   Jearl Klinefelter ELLIPTA 62.5-25 MCG/INH AEPB Inhale 1 puff into the lungs daily.  Marland Kitchen aspirin EC 81 MG tablet Take 81 mg by mouth every morning.  Marland Kitchen aspirin-acetaminophen-caffeine (EXCEDRIN MIGRAINE) 250-250-65 MG tablet Take 2 tablets by mouth every 6 (six) hours as needed for headache.  Marland Kitchen atenolol (TENORMIN) 25 MG tablet Take 25  mg by mouth daily.    Marland Kitchen atorvastatin (LIPITOR) 40 MG tablet Take 40 mg by mouth daily.  . Blood Glucose Monitoring Suppl (ACCU-CHEK GUIDE ME) w/Device KIT 1 Piece by Does not apply route as directed.  . DULoxetine (CYMBALTA) 60 MG capsule Take 60 mg by mouth daily.   Marland Kitchen esomeprazole (NEXIUM) 40 MG capsule Take 40 mg by mouth every morning.   . furosemide (LASIX) 40 MG tablet Take 40 mg by mouth as needed.  Marland Kitchen glimepiride (AMARYL) 2 MG tablet Take 1 mg by mouth every morning.   Marland Kitchen glucose blood (ACCU-CHEK GUIDE) test strip Use as instructed  . hydrochlorothiazide (MICROZIDE) 12.5 MG capsule Take 12.5 mg by mouth daily.  Marland Kitchen HYDROcodone-acetaminophen (NORCO/VICODIN) 5-325 MG tablet Take 1 tablet by mouth every 6 (six) hours as needed.  Marland Kitchen lisinopril (PRINIVIL,ZESTRIL) 10 MG tablet Take 5 mg by mouth every morning.  . metFORMIN (GLUCOPHAGE) 1000 MG tablet Take 1,000 mg by mouth 2 (two) times daily with a meal.    . Omega-3 Fatty Acids (FISH OIL PO) Take 1 capsule by mouth 2 (two) times daily.  . polyethylene glycol powder (GLYCOLAX/MIRALAX) powder Take 17 g by mouth daily.  . tamsulosin (FLOMAX) 0.4 MG CAPS capsule Take 0.4 mg by mouth daily.     No facility-administered encounter medications on file as of 12/26/2018.     ALLERGIES: Allergies  Allergen Reactions  . Codeine   . Morphine And Related Nausea And Vomiting    VACCINATION STATUS: Immunization History  Administered Date(s) Administered  . Tdap 03/11/2013    Diabetes  He presents for his initial diabetic visit. He has type 2 diabetes mellitus. Onset time: He was diagnosed at approximate age of 58 years. His disease course has been worsening. There are no hypoglycemic associated symptoms. Pertinent negatives for hypoglycemia include no confusion, headaches, pallor or seizures. Associated symptoms include polydipsia and polyuria. Pertinent negatives for diabetes include no chest pain, no fatigue, no polyphagia and no weakness. There are no hypoglycemic complications. Symptoms are worsening. Diabetic complications include peripheral neuropathy. Risk factors for coronary artery disease include diabetes mellitus, dyslipidemia, hypertension, male sex, obesity, sedentary lifestyle and tobacco exposure. His weight is fluctuating minimally. He is following a generally unhealthy diet. When asked about meal planning, he reported none. He has not had a previous visit with a dietitian. He never participates in exercise. His overall blood glucose range is 180-200 mg/dl. (He presents with a log showing random blood glucose profile, averaging 180 to 200 mg per DL, his recent A1c was 8% from Dec 01, 2018.) An ACE inhibitor/angiotensin II receptor blocker is being taken.  Hyperlipidemia  This is a chronic problem. The current episode started more than 1 year ago. Pertinent negatives include no chest pain, myalgias or shortness of breath. Current antihyperlipidemic treatment includes statins. Risk factors for coronary artery disease include diabetes mellitus, dyslipidemia, hypertension, male sex and a sedentary lifestyle.  Hypertension  This is a chronic problem. The current episode started more  than 1 year ago. The problem is controlled. Pertinent negatives include no chest pain, headaches, neck pain, palpitations or shortness of breath. Risk factors for coronary artery disease include diabetes mellitus, dyslipidemia, family history, male gender, obesity, sedentary lifestyle and smoking/tobacco exposure. Past treatments include ACE inhibitors and beta blockers.     Review of Systems  Constitutional: Negative for chills, fatigue, fever and unexpected weight change.  HENT: Negative for dental problem, mouth sores and trouble swallowing.   Eyes: Negative for visual disturbance.  Respiratory: Negative for cough, choking, chest tightness, shortness of breath and wheezing.   Cardiovascular: Negative for chest pain, palpitations and leg swelling.  Gastrointestinal: Negative for abdominal distention, abdominal pain, constipation, diarrhea, nausea and vomiting.  Endocrine: Positive for polydipsia and polyuria. Negative for polyphagia.  Genitourinary: Negative for dysuria, flank pain, hematuria and urgency.  Musculoskeletal: Negative for back pain, gait problem, myalgias and neck pain.  Skin: Negative for pallor, rash and wound.  Neurological: Negative for seizures, syncope, weakness, numbness and headaches.  Psychiatric/Behavioral: Negative for confusion and dysphoric mood.    Objective:    BP 111/75   Pulse (!) 104   Temp 98.2 F (36.8 C)   Ht 6' (1.829 m)   Wt 289 lb 12.8 oz (131.5 kg)   SpO2 96%   BMI 39.30 kg/m   Wt Readings from Last 3 Encounters:  12/26/18 289 lb 12.8 oz (131.5 kg)  08/22/18 290 lb 3.2 oz (131.6 kg)  12/11/15 287 lb (130.2 kg)     Physical Exam Constitutional:      General: He is not in acute distress.    Appearance: He is well-developed.  HENT:     Head: Normocephalic and atraumatic.  Neck:     Musculoskeletal: Normal range of motion and neck supple.     Thyroid: No thyromegaly.     Trachea: No tracheal deviation.  Cardiovascular:     Rate and  Rhythm: Normal rate.     Pulses:          Dorsalis pedis pulses are 1+ on the right side and 1+ on the left side.       Posterior tibial pulses are 1+ on the right side and 1+ on the left side.     Heart sounds: S1 normal and S2 normal. No murmur. No gallop.   Pulmonary:     Effort: Pulmonary effort is normal. No respiratory distress.     Breath sounds: No wheezing.  Abdominal:     General: There is no distension.     Tenderness: There is no abdominal tenderness. There is no guarding.  Musculoskeletal:     Right shoulder: He exhibits no swelling and no deformity.  Skin:    General: Skin is warm and dry.     Findings: No rash.     Nails: There is no clubbing.   Neurological:     Mental Status: He is alert and oriented to person, place, and time.     Cranial Nerves: No cranial nerve deficit.     Sensory: No sensory deficit.     Gait: Gait normal.     Deep Tendon Reflexes: Reflexes are normal and symmetric.  Psychiatric:        Mood and Affect: Mood normal.        Speech: Speech normal.        Behavior: Behavior normal. Behavior is cooperative.     CMP ( most recent) CMP     Component Value Date/Time   NA 138 08/22/2018 1052   K 4.0 08/22/2018 1052   CL 104 08/22/2018 1052   CO2 25 08/22/2018 1052   GLUCOSE 204 (H) 08/22/2018 1052   BUN 19 08/22/2018 1052   CREATININE 1.08 08/22/2018 1052   CALCIUM 9.3 08/22/2018 1052   PROT 8.2 (H) 08/01/2018 1123   ALBUMIN 4.1 08/01/2018 1123   AST 35 08/01/2018 1123   ALT 26 08/01/2018 1123   ALKPHOS 77 08/01/2018 1123   BILITOT 0.7 08/01/2018 1123   GFRNONAA >60  08/22/2018 1052   GFRAA >60 08/22/2018 1052    Lab Results  Component Value Date   TSH 0.822 08/01/2018    A1c on Dec 01, 2018 was 8%.  Assessment & Plan:   1. Uncontrolled type 2 diabetes mellitus with hyperglycemia ([place])  - Xavier Williams has currently uncontrolled symptomatic type 2 DM since 58 years of age,  with most recent A1c of 8 %. Recent labs reviewed. -  I had a long discussion with him about the progressive nature of diabetes and the pathology behind its complications. -his diabetes is complicated by obesity/sedentary life, history of chronic heavy smoking and he remains at a high risk for more acute and chronic complications which include CAD, CVA, CKD, retinopathy, and neuropathy. These are all discussed in detail with him.  - I have counseled him on diet management and weight loss, by adopting a carbohydrate restricted/protein rich diet. - he admits that there is a room for improvement in his food and drink choices. - Suggestion is made for him to avoid simple carbohydrates  from his diet including Cakes, Sweet Desserts, Ice Cream, Soda (diet and regular), Sweet Tea, Candies, Chips, Cookies, Store Bought Juices, Alcohol in Excess of  1-2 drinks a day, Artificial Sweeteners,  Coffee Creamer, and "Sugar-free" Products. This will help patient to have more stable blood glucose profile and potentially avoid unintended weight gain.  - I encouraged him to switch to  unprocessed or minimally processed complex starch and increased protein intake (animal or plant source), fruits, and vegetables.  - he is advised to stick to a routine mealtimes to eat 3 meals  a day and avoid unnecessary snacks ( to snack only to correct hypoglycemia).   - he will be scheduled with Jearld Fenton, RDN, CDE for individualized diabetes education.  - I have approached him with the following individualized plan to manage diabetes and patient agrees:   -He may need insulin treatment in order for him to achieve and maintain control of diabetes to target. -In preparation, he is advised to initiate strict monitoring of glucose 4 times a day-before meals and at bedtime.  - he is encouraged to call clinic for blood glucose levels less than 70 or above 300 mg /dl. - he is advised to continue metformin 1000 mg p.o. twice daily, therapeutically suitable for patient . -He is advised  to lower his glimepiride to 2 mg p.o. daily with breakfast.  - he will be considered for incretin therapy as appropriate next visit.  - Patient specific target  A1c;  LDL, HDL, Triglycerides, and  Waist Circumference were discussed in detail.  2) Blood Pressure /Hypertension:  his blood pressure is  controlled to target.   he is advised to continue his current medications including lisinopril 10 mg p.o. daily with breakfast . 3) Lipids/Hyperlipidemia: No recent lipid panel to review, he is advised to continue atorvastatin 40 mg p.o. nightly.    Side effects and precautions discussed with him.  4)  Weight/Diet:  Body mass index is 39.3 kg/m.  -   clearly complicating his diabetes care.  I discussed with him the fact that loss of 5 - 10% of his  current body weight will have the most impact on his diabetes management.  CDE Consult will be initiated . Exercise, and detailed carbohydrates information provided  -  detailed on discharge instructions.  5) Chronic Care/Health Maintenance:  -he  is on ACEI/ARB and Statin medications and  is encouraged to initiate and continue  to follow up with Ophthalmology, Dentist,  Podiatrist at least yearly or according to recommendations, and advised to   stay away from smoking. I have recommended yearly flu vaccine and pneumonia vaccine at least every 5 years; moderate intensity exercise for up to 150 minutes weekly; and  sleep for at least 7 hours a day.  - he is  advised to maintain close follow up with [facility] for primary care needs, as well as his other providers for optimal and coordinated care.  - Time spent with the patient: 45 minutes, of which >50% was spent in obtaining information about his symptoms, reviewing his previous labs/studies, evaluations, and treatments, counseling him about his currently uncontrolled type 2 diabetes, hyperlipidemia, hypertension, obesity/sedentary life, and developing plans for long term treatment  based on the latest standards of care/guidelines.  Please refer to " Patient Self Inventory" in the Media  tab for reviewed elements of pertinent patient history.  Xavier Williams participated in the discussions, expressed understanding, and voiced agreement with the above plans.  All questions were answered to his satisfaction. he is encouraged to contact clinic should he have any questions or concerns prior to his return visit.  Follow up plan: - Return in about 10 days (around 01/05/2019) for Meter, and Logs.  Glade Lloyd, MD [facility] [address] [place], [place] [address] Phone: [phone]  Fax: [phone]    12/26/2018, 5:16 PM  This note was partially dictated with voice recognition software. Similar sounding words can be transcribed inadequately or may not  be corrected upon review.

## 2018-12-26 NOTE — Patient Instructions (Signed)

## 2019-01-03 DIAGNOSIS — F17201 Nicotine dependence, unspecified, in remission: Secondary | ICD-10-CM | POA: Diagnosis not present

## 2019-01-03 DIAGNOSIS — F1011 Alcohol abuse, in remission: Secondary | ICD-10-CM | POA: Diagnosis not present

## 2019-01-03 DIAGNOSIS — F411 Generalized anxiety disorder: Secondary | ICD-10-CM | POA: Diagnosis not present

## 2019-01-03 DIAGNOSIS — F4310 Post-traumatic stress disorder, unspecified: Secondary | ICD-10-CM | POA: Diagnosis not present

## 2019-01-03 DIAGNOSIS — Z634 Disappearance and death of family member: Secondary | ICD-10-CM | POA: Diagnosis not present

## 2019-01-05 DIAGNOSIS — E119 Type 2 diabetes mellitus without complications: Secondary | ICD-10-CM | POA: Diagnosis not present

## 2019-01-08 ENCOUNTER — Ambulatory Visit: Payer: PPO | Admitting: "Endocrinology

## 2019-01-16 DIAGNOSIS — R42 Dizziness and giddiness: Secondary | ICD-10-CM | POA: Diagnosis not present

## 2019-01-16 DIAGNOSIS — M25569 Pain in unspecified knee: Secondary | ICD-10-CM | POA: Diagnosis not present

## 2019-01-16 DIAGNOSIS — H9319 Tinnitus, unspecified ear: Secondary | ICD-10-CM | POA: Diagnosis not present

## 2019-01-16 DIAGNOSIS — R2689 Other abnormalities of gait and mobility: Secondary | ICD-10-CM | POA: Diagnosis not present

## 2019-01-17 DIAGNOSIS — Z634 Disappearance and death of family member: Secondary | ICD-10-CM | POA: Diagnosis not present

## 2019-01-17 DIAGNOSIS — F17201 Nicotine dependence, unspecified, in remission: Secondary | ICD-10-CM | POA: Diagnosis not present

## 2019-01-17 DIAGNOSIS — F411 Generalized anxiety disorder: Secondary | ICD-10-CM | POA: Diagnosis not present

## 2019-01-17 DIAGNOSIS — F4310 Post-traumatic stress disorder, unspecified: Secondary | ICD-10-CM | POA: Diagnosis not present

## 2019-01-17 DIAGNOSIS — F1011 Alcohol abuse, in remission: Secondary | ICD-10-CM | POA: Diagnosis not present

## 2019-01-17 DIAGNOSIS — F332 Major depressive disorder, recurrent severe without psychotic features: Secondary | ICD-10-CM | POA: Diagnosis not present

## 2019-01-24 ENCOUNTER — Encounter: Payer: Self-pay | Admitting: Nutrition

## 2019-01-24 ENCOUNTER — Encounter: Payer: PPO | Attending: Family | Admitting: Nutrition

## 2019-01-24 ENCOUNTER — Other Ambulatory Visit: Payer: Self-pay

## 2019-01-24 DIAGNOSIS — E1165 Type 2 diabetes mellitus with hyperglycemia: Secondary | ICD-10-CM | POA: Insufficient documentation

## 2019-01-24 DIAGNOSIS — E782 Mixed hyperlipidemia: Secondary | ICD-10-CM | POA: Insufficient documentation

## 2019-01-24 DIAGNOSIS — E663 Overweight: Secondary | ICD-10-CM | POA: Insufficient documentation

## 2019-01-24 DIAGNOSIS — I10 Essential (primary) hypertension: Secondary | ICD-10-CM | POA: Insufficient documentation

## 2019-01-24 NOTE — Patient Instructions (Signed)
Goals Follow My Plate  Eat thee meals per day  3-4 carb choices per meal Avoid fried and processed foods  Keep walking or exercising daily. Lose 1-2 lbs per week. Get A1C to 7% or less. Test BS twice a day

## 2019-01-24 NOTE — Progress Notes (Signed)
  Medical Nutrition Therapy:  Appt start time: 1330 end time:  1430.   Assessment:  Primary concerns today: Diabetes Type 2, Neuropathy. Has cut out eating sugar and uses honey now. He now feels his feet where he use to not be able to feel them. Sees Dr. Dorris Fetch, Endocrinology. Rosealee Albee, PCP. Eats 3 meals day. Has lost 27 lbs. His wife does the cooking and shopping. Eats out a lot and has been cutting back on portions..  FBS: 160-170's. Glimperide 2 mg  and Metformin 1000 mg BID  Lab Results  Component Value Date   HGBA1C 8 12/01/2018   CMP Latest Ref Rng & Units 08/22/2018 08/01/2018 12/11/2015  Glucose 70 - 99 mg/dL 204(H) 162(H) 201(H)  BUN 6 - 20 mg/dL 19 23(H) 18  Creatinine 0.61 - 1.24 mg/dL 1.08 1.17 1.23  Sodium 135 - 145 mmol/L 138 138 135  Potassium 3.5 - 5.1 mmol/L 4.0 3.8 3.8  Chloride 98 - 111 mmol/L 104 103 101  CO2 22 - 32 mmol/L _0 Calcium 8.9 - 10.3 mg/dL 9.3 10.0 9.3  Total Protein 6.5 - 8.1 g/dL - 8.2(H) -  Total Bilirubin 0.3 - 1.2 mg/dL - 0.7 -  Alkaline Phos 38 - 126 U/L - 77 -  AST 15 - 41 U/L - 35 -  ALT 0 - 44 U/L - 26 -     .   Preferred Learning Style:   No preference indicated   Learning Readiness:  Ready  Change in progress   MEDICATIONS   DIETARY INTAKE:    24-hr recall:  B ( AM): smoothies or oatmeal, fruit. Snk ( AM):  L ( PM): Catfish broiled, sweet potato, water Snk ( PM):  D ( PM):  Fried chicken, mac/cheese and green beans, water Snk ( PM):  Beverages: water, diet soda  Usual physical activity: walking 60 minutes.   Estimated energy needs: 1800 calories 200 g carbohydrates 135 g protein 50 fat  Progress Towards Goal(s):  In progress.   Nutritional Diagnosis:  NB-1.1 Food and nutrition-related knowledge deficit As related to Diabetes.  As evidenced by A1C 8%.    Intervention:  Nutrition and Diabetes education provided on My Plate, CHO counting, meal planning, portion sizes, timing of meals, avoiding snacks  between meals unless having a low blood sugar, target ranges for A1C and blood sugars, signs/symptoms and treatment of hyper/hypoglycemia, monitoring blood sugars, taking medications as prescribed, benefits of exercising 30 minutes per day and prevention of complications of DM.  Goals Follow My Plate  Eat thee meals per day  3-4 carb choices per meal Avoid fried and processed foods  Keep walking or exercising daily. Lose 1-2 lbs per week. Get A1C to 7% or less. Test BS twice a day Teaching Method Utilized:  Visual Auditory Hands on  Handouts given during visit include:  The Plate Method   Meal Plan Card  Diabetes Instructions.   Barriers to learning/adherence to lifestyle change: none  Demonstrated degree of understanding via:  Teach Back   Monitoring/Evaluation:  Dietary intake, exercise, , and body weight in 1 month(s).

## 2019-01-25 ENCOUNTER — Other Ambulatory Visit: Payer: Self-pay | Admitting: Neurology

## 2019-01-25 ENCOUNTER — Other Ambulatory Visit (HOSPITAL_COMMUNITY): Payer: Self-pay | Admitting: Neurology

## 2019-01-25 DIAGNOSIS — M5416 Radiculopathy, lumbar region: Secondary | ICD-10-CM

## 2019-01-31 DIAGNOSIS — F4310 Post-traumatic stress disorder, unspecified: Secondary | ICD-10-CM | POA: Diagnosis not present

## 2019-01-31 DIAGNOSIS — K439 Ventral hernia without obstruction or gangrene: Secondary | ICD-10-CM | POA: Diagnosis not present

## 2019-01-31 DIAGNOSIS — K5901 Slow transit constipation: Secondary | ICD-10-CM | POA: Diagnosis not present

## 2019-02-01 ENCOUNTER — Other Ambulatory Visit: Payer: Self-pay

## 2019-02-01 ENCOUNTER — Other Ambulatory Visit: Payer: Self-pay | Admitting: Family

## 2019-02-01 ENCOUNTER — Ambulatory Visit (HOSPITAL_COMMUNITY)
Admission: RE | Admit: 2019-02-01 | Discharge: 2019-02-01 | Disposition: A | Discharge status: Home / Self Care | Payer: PPO | Source: Ambulatory Visit | Attending: Neurology | Admitting: Neurology

## 2019-02-01 ENCOUNTER — Other Ambulatory Visit (HOSPITAL_COMMUNITY): Payer: Self-pay | Admitting: Family

## 2019-02-01 DIAGNOSIS — K439 Ventral hernia without obstruction or gangrene: Secondary | ICD-10-CM

## 2019-02-01 DIAGNOSIS — M545 Low back pain: Secondary | ICD-10-CM | POA: Diagnosis not present

## 2019-02-01 DIAGNOSIS — M5416 Radiculopathy, lumbar region: Secondary | ICD-10-CM | POA: Diagnosis not present

## 2019-02-12 DIAGNOSIS — M545 Low back pain: Secondary | ICD-10-CM | POA: Diagnosis not present

## 2019-02-12 DIAGNOSIS — I10 Essential (primary) hypertension: Secondary | ICD-10-CM | POA: Diagnosis not present

## 2019-02-12 DIAGNOSIS — J449 Chronic obstructive pulmonary disease, unspecified: Secondary | ICD-10-CM | POA: Diagnosis not present

## 2019-02-12 DIAGNOSIS — G4733 Obstructive sleep apnea (adult) (pediatric): Secondary | ICD-10-CM | POA: Diagnosis not present

## 2019-02-15 DIAGNOSIS — Z634 Disappearance and death of family member: Secondary | ICD-10-CM | POA: Diagnosis not present

## 2019-02-15 DIAGNOSIS — F332 Major depressive disorder, recurrent severe without psychotic features: Secondary | ICD-10-CM | POA: Diagnosis not present

## 2019-02-15 DIAGNOSIS — F1011 Alcohol abuse, in remission: Secondary | ICD-10-CM | POA: Diagnosis not present

## 2019-02-15 DIAGNOSIS — F17201 Nicotine dependence, unspecified, in remission: Secondary | ICD-10-CM | POA: Diagnosis not present

## 2019-02-15 DIAGNOSIS — F411 Generalized anxiety disorder: Secondary | ICD-10-CM | POA: Diagnosis not present

## 2019-02-15 DIAGNOSIS — F4310 Post-traumatic stress disorder, unspecified: Secondary | ICD-10-CM | POA: Diagnosis not present

## 2019-02-21 ENCOUNTER — Ambulatory Visit (HOSPITAL_COMMUNITY): Payer: PPO

## 2019-02-21 ENCOUNTER — Encounter (HOSPITAL_COMMUNITY): Payer: Self-pay

## 2019-03-01 ENCOUNTER — Encounter: Payer: Self-pay | Admitting: Nutrition

## 2019-03-06 DIAGNOSIS — M545 Low back pain: Secondary | ICD-10-CM | POA: Diagnosis not present

## 2019-03-06 DIAGNOSIS — R42 Dizziness and giddiness: Secondary | ICD-10-CM | POA: Diagnosis not present

## 2019-03-06 DIAGNOSIS — R2689 Other abnormalities of gait and mobility: Secondary | ICD-10-CM | POA: Diagnosis not present

## 2019-03-06 DIAGNOSIS — H9319 Tinnitus, unspecified ear: Secondary | ICD-10-CM | POA: Diagnosis not present

## 2019-03-06 DIAGNOSIS — M25569 Pain in unspecified knee: Secondary | ICD-10-CM | POA: Diagnosis not present

## 2019-03-13 ENCOUNTER — Other Ambulatory Visit: Payer: Self-pay

## 2019-03-13 ENCOUNTER — Encounter: Payer: Self-pay | Admitting: Nutrition

## 2019-03-13 ENCOUNTER — Encounter: Payer: PPO | Attending: Family | Admitting: Nutrition

## 2019-03-13 VITALS — Ht 72.0 in | Wt 276.0 lb

## 2019-03-13 DIAGNOSIS — I10 Essential (primary) hypertension: Secondary | ICD-10-CM | POA: Insufficient documentation

## 2019-03-13 DIAGNOSIS — E1165 Type 2 diabetes mellitus with hyperglycemia: Secondary | ICD-10-CM | POA: Insufficient documentation

## 2019-03-13 DIAGNOSIS — E782 Mixed hyperlipidemia: Secondary | ICD-10-CM | POA: Insufficient documentation

## 2019-03-13 DIAGNOSIS — E663 Overweight: Secondary | ICD-10-CM | POA: Insufficient documentation

## 2019-03-13 NOTE — Progress Notes (Signed)
  Medical Nutrition Therapy:  Appt start time: 1330 end time:  1400.   Assessment:  Primary concerns today: Diabetes Type 2, Neuropathy. He has cut out sugar. Has feeling back in his feet. FBS 107-130 mg/dl. Evenings; 115-150's. Sees Dr. Merlene Laughter for his pain medication. He is going to start getting epidural shots in his back. Feels much better now and more energy. Metformin 1000 mg BID and Glimepiride 4 mg .once a day. [facility]  Lab Results  Component Value Date   HGBA1C 8 12/01/2018   CMP Latest Ref Rng & Units 08/22/2018 08/01/2018 12/11/2015  Glucose 70 - 99 mg/dL 204(H) 162(H) 201(H)  BUN 6 - 20 mg/dL 19 23(H) 18  Creatinine 0.61 - 1.24 mg/dL 1.08 1.17 1.23  Sodium 135 - 145 mmol/L 138 138 135  Potassium 3.5 - 5.1 mmol/L 4.0 3.8 3.8  Chloride 98 - 111 mmol/L 104 103 101  CO2 22 - 32 mmol/L 25 24 23   Calcium 8.9 - 10.3 mg/dL 9.3 10.0 9.3  Total Protein 6.5 - 8.1 g/dL - 8.2(H) -  Total Bilirubin 0.3 - 1.2 mg/dL - 0.7 -  Alkaline Phos 38 - 126 U/L - 77 -  AST 15 - 41 U/L - 35 -  ALT 0 - 44 U/L - 26 -     .   Preferred Learning Style:   No preference indicated   Learning Readiness:  Ready  Change in progress   MEDICATIONS   DIETARY INTAKE:    24-hr recall:  B ( AM): Oatmeal 2 cups, water. Snk ( AM):  L ( PM): [place] sandwish on ww bread, MT Dew Zero. Snk ( PM):  D ( PM):  Big salad-vinegarette , sweet potato and steak, water Snk ( PM):  Beverages: water, diet soda Usual physical activity: walking 60 minutes.   Estimated energy needs: 1800 calories 200 g carbohydrates 135 g protein 50 fat  Progress Towards Goal(s):  In progress.   Nutritional Diagnosis:  NB-1.1 Food and nutrition-related knowledge deficit As related to Diabetes.  As evidenced by A1C 8%.    Intervention:  Nutrition and Diabetes education provided on My Plate, CHO counting, meal planning, portion sizes, timing of meals, avoiding snacks between meals unless having a low blood sugar,  target ranges for A1C and blood sugars, signs/symptoms and treatment of hyper/hypoglycemia, monitoring blood sugars, taking medications as prescribed, benefits of exercising 30 minutes per day and prevention of complications of DM.  Goals Follow My Plate  Eat thee meals per day  3-4 carb choices per meal Avoid fried and processed foods  Keep walking or exercising daily. Lose 1-2 lbs per week. Get A1C to 7% or less. Test BS twice a day  Teaching Method Utilized:  Visual Auditory Hands on  Handouts given during visit include:  The Plate Method   Meal Plan Card  Diabetes Instructions.   Barriers to learning/adherence to lifestyle change: none  Demonstrated degree of understanding via:  Teach Back   Monitoring/Evaluation:  Dietary intake, exercise, , and body weight in 1 month(s).

## 2019-03-19 DIAGNOSIS — F332 Major depressive disorder, recurrent severe without psychotic features: Secondary | ICD-10-CM | POA: Diagnosis not present

## 2019-03-19 DIAGNOSIS — F1011 Alcohol abuse, in remission: Secondary | ICD-10-CM | POA: Diagnosis not present

## 2019-03-19 DIAGNOSIS — F17201 Nicotine dependence, unspecified, in remission: Secondary | ICD-10-CM | POA: Diagnosis not present

## 2019-03-19 DIAGNOSIS — F4310 Post-traumatic stress disorder, unspecified: Secondary | ICD-10-CM | POA: Diagnosis not present

## 2019-03-19 DIAGNOSIS — Z634 Disappearance and death of family member: Secondary | ICD-10-CM | POA: Diagnosis not present

## 2019-03-19 DIAGNOSIS — F411 Generalized anxiety disorder: Secondary | ICD-10-CM | POA: Diagnosis not present

## 2019-04-04 ENCOUNTER — Encounter: Payer: Self-pay | Admitting: Nutrition

## 2019-04-04 NOTE — Patient Instructions (Signed)
  Goals Follow My Plate  Eat thee meals per day  3-4 carb choices per meal Avoid fried and processed foods  Keep walking or exercising daily. Lose 1-2 lbs per week. Get A1C to 7% or less. Test BS twice a day

## 2019-04-11 DIAGNOSIS — D649 Anemia, unspecified: Secondary | ICD-10-CM | POA: Diagnosis not present

## 2019-04-11 DIAGNOSIS — I10 Essential (primary) hypertension: Secondary | ICD-10-CM | POA: Diagnosis not present

## 2019-04-11 DIAGNOSIS — E119 Type 2 diabetes mellitus without complications: Secondary | ICD-10-CM | POA: Diagnosis not present

## 2019-05-29 DIAGNOSIS — R42 Dizziness and giddiness: Secondary | ICD-10-CM | POA: Diagnosis not present

## 2019-05-29 DIAGNOSIS — R2689 Other abnormalities of gait and mobility: Secondary | ICD-10-CM | POA: Diagnosis not present

## 2019-05-29 DIAGNOSIS — M25569 Pain in unspecified knee: Secondary | ICD-10-CM | POA: Diagnosis not present

## 2019-05-29 DIAGNOSIS — H9319 Tinnitus, unspecified ear: Secondary | ICD-10-CM | POA: Diagnosis not present

## 2019-06-12 ENCOUNTER — Ambulatory Visit: Payer: PPO | Admitting: Nutrition

## 2019-06-25 DIAGNOSIS — J329 Chronic sinusitis, unspecified: Secondary | ICD-10-CM | POA: Diagnosis not present

## 2019-06-25 DIAGNOSIS — J40 Bronchitis, not specified as acute or chronic: Secondary | ICD-10-CM | POA: Diagnosis not present

## 2019-07-02 ENCOUNTER — Emergency Department (HOSPITAL_COMMUNITY): Payer: PPO

## 2019-07-02 ENCOUNTER — Other Ambulatory Visit: Payer: Self-pay

## 2019-07-02 ENCOUNTER — Encounter (HOSPITAL_COMMUNITY): Payer: Self-pay

## 2019-07-02 ENCOUNTER — Emergency Department (HOSPITAL_COMMUNITY)
Admission: EM | Admit: 2019-07-02 | Discharge: 2019-07-02 | Disposition: A | Discharge status: Home / Self Care | Payer: PPO | Attending: Emergency Medicine | Admitting: Emergency Medicine

## 2019-07-02 DIAGNOSIS — Z79899 Other long term (current) drug therapy: Secondary | ICD-10-CM | POA: Insufficient documentation

## 2019-07-02 DIAGNOSIS — B9789 Other viral agents as the cause of diseases classified elsewhere: Secondary | ICD-10-CM | POA: Diagnosis not present

## 2019-07-02 DIAGNOSIS — I10 Essential (primary) hypertension: Secondary | ICD-10-CM | POA: Diagnosis not present

## 2019-07-02 DIAGNOSIS — Z7984 Long term (current) use of oral hypoglycemic drugs: Secondary | ICD-10-CM | POA: Insufficient documentation

## 2019-07-02 DIAGNOSIS — Z7982 Long term (current) use of aspirin: Secondary | ICD-10-CM | POA: Diagnosis not present

## 2019-07-02 DIAGNOSIS — R05 Cough: Secondary | ICD-10-CM | POA: Diagnosis not present

## 2019-07-02 DIAGNOSIS — U071 COVID-19: Secondary | ICD-10-CM | POA: Insufficient documentation

## 2019-07-02 DIAGNOSIS — Z20828 Contact with and (suspected) exposure to other viral communicable diseases: Secondary | ICD-10-CM | POA: Diagnosis not present

## 2019-07-02 DIAGNOSIS — Z87891 Personal history of nicotine dependence: Secondary | ICD-10-CM | POA: Diagnosis not present

## 2019-07-02 DIAGNOSIS — J069 Acute upper respiratory infection, unspecified: Secondary | ICD-10-CM | POA: Diagnosis not present

## 2019-07-02 DIAGNOSIS — E119 Type 2 diabetes mellitus without complications: Secondary | ICD-10-CM | POA: Diagnosis not present

## 2019-07-02 MED ORDER — BENZONATATE 100 MG PO CAPS: 100.0000 mg | ORAL_CAPSULE | Freq: Three times a day (TID) | ORAL | 0 refills | Status: DC | PRN

## 2019-07-02 MED ORDER — NAPROXEN 375 MG PO TABS: 375.0000 mg | ORAL_TABLET | Freq: Two times a day (BID) | ORAL | 0 refills | Status: DC

## 2019-07-02 NOTE — Discharge Instructions (Signed)
Please continue take Mucinex for your cough as well as Tylenol for your fevers and chills.  Please take your naproxen as needed for fever and chills control as well as for your body aches.  Please take the Tessalon Perls, as prescribed for relief of your cough.  Please return to the ED or seek medical attention for any new or worsening shortness of breath, chest pain, inability to eat or drink, uncontrolled nausea or vomiting, fevers and chills uncontrolled by Tylenol or ibuprofen, or any other new or worsening symptoms.   If you live with, or provide care at home for, a person confirmed to have, or being evaluated for, COVID-19 infection please follow these guidelines to prevent infection:  Follow healthcare providers instructions Make sure that you understand and can help the patient follow any healthcare provider instructions for all care.  Provide for the patients basic needs You should help the patient with basic needs in the home and provide support for getting groceries, prescriptions, and other personal needs.  Monitor the patients symptoms If they are getting sicker, call his or her medical provider a  This will help the healthcare providers office take steps to keep other people from getting infected. Ask the healthcare provider to call the local or state health department.  Limit the number of people who have contact with the patient If possible, have only one caregiver for the patient. Other household members should stay in another home or place of residence. If this is not possible, they should stay in another room, or be separated from the patient as much as possible. Use a separate bathroom, if available. Restrict visitors who do not have an essential need to be in the home.  Keep older adults, very young children, and other sick people away from the patient Keep older adults, very young children, and those who have compromised immune systems or chronic health  conditions away from the patient. This includes people with chronic heart, lung, or kidney conditions, diabetes, and cancer.  Ensure good ventilation Make sure that shared spaces in the home have good air flow, such as from an air conditioner or an opened window, weather permitting.  Wash your hands often Wash your hands often and thoroughly with soap and water for at least 20 seconds. You can use an alcohol based hand sanitizer if soap and water are not available and if your hands are not visibly dirty. Avoid touching your eyes, nose, and mouth with unwashed hands. Use disposable paper towels to dry your hands. If not available, use dedicated cloth towels and replace them when they become wet.  Wear a facemask and gloves Wear a disposable facemask at all times in the room and gloves when you touch or have contact with the patients blood, body fluids, and/or secretions or excretions, such as sweat, saliva, sputum, nasal mucus, vomit, urine, or feces.  Ensure the mask fits over your nose and mouth tightly, and do not touch it during use. Throw out disposable facemasks and gloves after using them. Do not reuse. Wash your hands immediately after removing your facemask and gloves. If your personal clothing becomes contaminated, carefully remove clothing and launder. Wash your hands after handling contaminated clothing. Place all used disposable facemasks, gloves, and other waste in a lined container before disposing them with other household waste. Remove gloves and wash your hands immediately after handling these items.  Do not share dishes, glasses, or other household items with the patient Avoid sharing household items. You should  not share dishes, drinking glasses, cups, eating utensils, towels, bedding, or other items After the person uses these items, you should wash them thoroughly with soap and water.  Wash laundry thoroughly Immediately remove and wash clothes or bedding that have  blood, body fluids, and/or secretions or excretions, such as sweat, saliva, sputum, nasal mucus, vomit, urine, or feces, on them. Wear gloves when handling laundry from the patient. Read and follow directions on labels of laundry or clothing items and detergent. In general, wash and dry with the warmest temperatures recommended on the label.  Clean all areas the individual has used often Clean all touchable surfaces, such as counters, tabletops, doorknobs, bathroom fixtures, toilets, phones, keyboards, tablets, and bedside tables, every day. Also, clean any surfaces that may have blood, body fluids, and/or secretions or excretions on them. Wear gloves when cleaning surfaces the patient has come in contact with. Use a diluted bleach solution (e.g., dilute bleach with 1 part bleach and 10 parts water) or a household disinfectant with a label that says EPA-registered for coronaviruses. To make a bleach solution at home, add 1 tablespoon of bleach to 1 quart (4 cups) of water. For a larger supply, add  cup of bleach to 1 gallon (16 cups) of water. Read labels of cleaning products and follow recommendations provided on product labels. Labels contain instructions for safe and effective use of the cleaning product including precautions you should take when applying the product, such as wearing gloves or eye protection and making sure you have good ventilation during use of the product. Remove gloves and wash hands immediately after cleaning.  Monitor yourself for signs and symptoms of illness Caregivers and household members are considered close contacts, should monitor their health, and will be asked to limit movement outside of the home to the extent possible. Follow the monitoring steps for close contacts listed on the symptom monitoring form.   ? If you have additional questions, contact your local health department or call the epidemiologist on call at [phone] (available 24/7). ? This guidance  is subject to change. For the most up-to-date guidance from [facility], please refer to their website: [email or web address]

## 2019-07-02 NOTE — ED Provider Notes (Addendum)
[facility] EMERGENCY DEPARTMENT Provider Note   CSN: [id number] Arrival date & time: 07/02/19  1106     History   Chief Complaint Chief Complaint  Patient presents with   Cough    HPI Xavier Williams is a 58 y.o. male with PMH significant for DM, OSA, HTN, HLD, who presents to the ED with a 10-day history of URI symptoms.  Patient did virtual visit with his primary care provider in [facility] prescribed him antibiotics for suspected bacterial pneumonia.  Patient denies any improvement with antibiotics.  He endorses headache, sore throat, nonproductive cough, fevers and chills, and body aches.  He denies any dizziness, shortness of breath symptoms, nausea or vomiting, abdominal pain, loose stools, melena or hematochezia, chest pain, or obvious sick contacts.  He lives with his wife who also has endorsed a cough.  Neither of them have been tested for COVID-19.  He has been taking Mucinex, with some effect.  He is also taken Tylenol for his fevers and chills, which has helped.  His last Tylenol was last night.     HPI  Past Medical History:  Diagnosis Date   Chronic back pain    DDD (degenerative disc disease), lumbar    Depression    Diabetes mellitus    Hyperlipidemia    Hypertension    Kidney stones    Schizophrenia [facility])    Sleep apnea     Patient Active Problem List   Diagnosis Date Noted   Uncontrolled type 2 diabetes mellitus with hyperglycemia ([place]) 12/26/2018   Essential hypertension, benign 12/26/2018   Mixed hyperlipidemia 04/22/2009   OVERWEIGHT/OBESITY 04/22/2009   CHEST PAIN-UNSPECIFIED 04/22/2009    Past Surgical History:  Procedure Laterality Date   CARDIAC CATHETERIZATION  2005   normal coronary arteries   COLONOSCOPY          Home Medications    Prior to Admission medications   Medication Sig Start Date End Date Taking? Authorizing Provider  albuterol (PROVENTIL HFA;VENTOLIN HFA) 108 (90 Base) MCG/ACT inhaler Inhale 2  puffs into the lungs every 6 (six) hours as needed for wheezing or shortness of breath.    [provider]  allopurinol (ZYLOPRIM) 100 MG tablet Take 100 mg by mouth daily.  12/04/15   [provider]  ANORO ELLIPTA 62.5-25 MCG/INH AEPB Inhale 1 puff into the lungs daily. 08/21/18   [provider]  aspirin EC 81 MG tablet Take 81 mg by mouth every morning.    [provider]  aspirin-acetaminophen-caffeine (EXCEDRIN MIGRAINE) [phone] MG tablet Take 2 tablets by mouth every 6 (six) hours as needed for headache.    [provider]  atenolol (TENORMIN) 25 MG tablet Take 25 mg by mouth daily.      [provider]  atorvastatin (LIPITOR) 40 MG tablet Take 40 mg by mouth daily. 10/10/15   [provider]  benzonatate (TESSALON) 100 MG capsule Take 1 capsule (100 mg total) by mouth every 8 (eight) hours as needed for cough. 07/02/19   Corena Herter, PA-C  Blood Glucose Monitoring Suppl (ACCU-CHEK GUIDE ME) w/Device KIT 1 Piece by Does not apply route as directed. 12/26/18   Cassandria Anger, MD  DULoxetine (CYMBALTA) 60 MG capsule Take 60 mg by mouth daily.  12/09/15   [provider]  esomeprazole (NEXIUM) 40 MG capsule Take 40 mg by mouth every morning.  10/10/15   [provider]  furosemide (LASIX) 40 MG tablet Take 40 mg by  mouth as needed.    [provider]  glimepiride (AMARYL) 2 MG tablet Take 1 mg by mouth every morning.     [provider]  glucose blood (ACCU-CHEK GUIDE) test strip Use as instructed 12/26/18   Cassandria Anger, MD  hydrochlorothiazide (MICROZIDE) 12.5 MG capsule Take 12.5 mg by mouth daily.    [provider]  HYDROcodone-acetaminophen (NORCO/VICODIN) 5-325 MG tablet Take 1 tablet by mouth every 6 (six) hours as needed. 10/25/15   Milton Ferguson, MD  lisinopril (PRINIVIL,ZESTRIL) 10 MG tablet Take 5 mg by mouth every morning.    [provider]  metFORMIN  (GLUCOPHAGE) 1000 MG tablet Take 1,000 mg by mouth 2 (two) times daily with a meal.      [provider]  naproxen (NAPROSYN) 375 MG tablet Take 1 tablet (375 mg total) by mouth 2 (two) times daily with a meal. Take 2 times daily with meals as needed for fever, chills, or body aches. 07/02/19   Corena Herter, PA-C  Omega-3 Fatty Acids (FISH OIL PO) Take 1 capsule by mouth 2 (two) times daily.    [provider]  polyethylene glycol powder (GLYCOLAX/MIRALAX) powder Take 17 g by mouth daily. 10/10/15   [provider]  potassium citrate (UROCIT-K) 10 MEQ (1080 MG) SR tablet Take 10 mEq by mouth daily.  12/04/15   [provider]  tamsulosin (FLOMAX) 0.4 MG CAPS capsule Take 0.4 mg by mouth daily.  11/25/15   [provider]    Family History Family History  Problem Relation Age of Onset   Heart attack Mother     Social History Social History   Tobacco Use   Smoking status: Former Smoker   Smokeless tobacco: Former Systems developer    Types: Chew  Substance Use Topics   Alcohol use: No   Drug use: No     Allergies   Codeine and Morphine and related   Review of Systems Review of Systems  All other systems reviewed and are negative.    Physical Exam Updated Vital Signs BP 121/74    Pulse 82    Temp 98.8 F (37.1 C) (Oral)    Resp 20    Ht 6' (1.829 m)    Wt 122.5 kg    SpO2 95%    BMI 36.62 kg/m   Physical Exam Vitals and nursing note reviewed. Exam conducted with a chaperone present.  Constitutional:      Appearance: Normal appearance.  HENT:     Head: Normocephalic and atraumatic.  Eyes:     General: No scleral icterus.    Conjunctiva/sclera: Conjunctivae normal.  Cardiovascular:     Rate and Rhythm: Normal rate and regular rhythm.     Pulses: Normal pulses.     Heart sounds: Normal heart sounds.  Pulmonary:     Effort: Pulmonary effort is normal. No respiratory distress.  Skin:    General: Skin is dry.  Neurological:      Mental Status: He is alert and oriented to person, place, and time.     GCS: GCS eye subscore is 4. GCS verbal subscore is 5. GCS motor subscore is 6.  Psychiatric:        Mood and Affect: Mood normal.        Behavior: Behavior normal.        Thought Content: Thought content normal.      ED Treatments / Results  Labs (all labs ordered are listed, but only abnormal results are  displayed) Labs Reviewed  SARS CORONAVIRUS 2 (TAT 6-24 HRS) - Abnormal; Notable for the following components:      Result Value   SARS Coronavirus 2 POSITIVE (*)    All other components within normal limits    EKG None  Radiology No results found.  Procedures Procedures (including critical care time)  Medications Ordered in ED Medications - No data to display   Initial Impression / Assessment and Plan / ED Course  I have reviewed the triage vital signs and the nursing notes.  Pertinent labs & imaging results that were available during my care of the patient were reviewed by me and considered in my medical decision making (see chart for details).        DG chest demonstrates findings concerning for multifocal pneumonia, likely viral etiology.  Patient believes that he was prescribed Augmentin, which he has completed without any symptomatic relief.  His report of fevers and chills, body aches, and sore throat in addition to his nonproductive cough is concerning for viral upper respiratory infection, likely COVID-19.  Will obtain COVID-19 testing.  He denies any shortness of breath or chest pain and he is in no acute distress here in the ED.  Will prescribe Tessalon Perles given his cough keeping him up at night in addition to his chest wall discomfort and body aches, worse with coughing.  Will also prescribe naproxen as he reports that he is able to take NSAIDs.  Last BMP obtained demonstrated normal creatinine and renal function.  Instructed patient to return to the ED or seek medical attention for any  new or worsening shortness of breath, chest pain, inability eat or drink, uncontrolled nausea or vomiting, fevers and chills uncontrolled by phone or ibuprofen, or any other new or worsening symptoms. All of the evaluation and work-up results were discussed with the patient and any family at bedside. They were provided opportunity to ask any additional questions and have none at this time. They have expressed understanding of verbal discharge instructions as well as return precautions and are agreeable to the plan.   Xavier Williams was evaluated in Emergency Department on 07/18/2019 for the symptoms described in the history of present illness. He was evaluated in the context of the global COVID-19 pandemic, which necessitated consideration that the patient might be at risk for infection with the SARS-CoV-2 virus that causes COVID-19. Institutional protocols and algorithms that pertain to the evaluation of patients at risk for COVID-19 are in a state of rapid change based on information released by regulatory bodies including the CDC and federal and state organizations. These policies and algorithms were followed during the patient's care in the ED.   Final Clinical Impressions(s) / ED Diagnoses   Final diagnoses:  Viral URI with cough    ED Discharge Orders         Ordered    benzonatate (TESSALON) 100 MG capsule  Every 8 hours PRN     07/02/19 1628    naproxen (NAPROSYN) 375 MG tablet  2 times daily with meals     07/02/19 1628           Corena Herter, PA-C 07/02/19 1633    Milton Ferguson, MD 07/06/19 0939    Corena Herter, PA-C 07/18/19 9833    Milton Ferguson, MD 07/18/19 [phone]

## 2019-07-02 NOTE — ED Triage Notes (Signed)
Pt states was seen at his doctor 2-3 weeks ago for URI and has not gotten any better. Pt states he has had a fever up to 101, cough and chills.

## 2019-07-03 LAB — SARS CORONAVIRUS 2 (TAT 6-24 HRS): SARS Coronavirus 2: POSITIVE — AB

## 2019-07-04 ENCOUNTER — Telehealth: Payer: Self-pay | Admitting: Nurse Practitioner

## 2019-07-04 NOTE — Telephone Encounter (Signed)
Called to Discuss with patient about Covid symptoms and the use of bamlanivimab, a monoclonal antibody infusion for those with mild to moderate Covid symptoms and at a high risk of hospitalization.     Pt is qualified for this infusion at the [facility] infusion center due to co-morbid conditions and/or a member of an at-risk group.    Patient is managed for the following: Patient Active Problem List   Diagnosis Date Noted  . Uncontrolled type 2 diabetes mellitus with hyperglycemia ([place]) 12/26/2018  . Essential hypertension, benign 12/26/2018  . Mixed hyperlipidemia 04/22/2009  . OVERWEIGHT/OBESITY 04/22/2009  . CHEST PAIN-UNSPECIFIED 04/22/2009      Unable to reach pt

## 2019-07-12 DIAGNOSIS — Z1159 Encounter for screening for other viral diseases: Secondary | ICD-10-CM | POA: Diagnosis not present

## 2019-07-17 DIAGNOSIS — F411 Generalized anxiety disorder: Secondary | ICD-10-CM | POA: Diagnosis not present

## 2019-07-17 DIAGNOSIS — F332 Major depressive disorder, recurrent severe without psychotic features: Secondary | ICD-10-CM | POA: Diagnosis not present

## 2019-07-24 DIAGNOSIS — H524 Presbyopia: Secondary | ICD-10-CM | POA: Diagnosis not present

## 2019-07-26 DIAGNOSIS — F4310 Post-traumatic stress disorder, unspecified: Secondary | ICD-10-CM | POA: Diagnosis not present

## 2019-07-26 DIAGNOSIS — F332 Major depressive disorder, recurrent severe without psychotic features: Secondary | ICD-10-CM | POA: Diagnosis not present

## 2019-07-26 DIAGNOSIS — F17201 Nicotine dependence, unspecified, in remission: Secondary | ICD-10-CM | POA: Diagnosis not present

## 2019-07-26 DIAGNOSIS — Z634 Disappearance and death of family member: Secondary | ICD-10-CM | POA: Diagnosis not present

## 2019-07-26 DIAGNOSIS — F411 Generalized anxiety disorder: Secondary | ICD-10-CM | POA: Diagnosis not present

## 2019-07-26 DIAGNOSIS — F1011 Alcohol abuse, in remission: Secondary | ICD-10-CM | POA: Diagnosis not present

## 2019-08-07 DIAGNOSIS — F332 Major depressive disorder, recurrent severe without psychotic features: Secondary | ICD-10-CM | POA: Diagnosis not present

## 2019-08-07 DIAGNOSIS — F411 Generalized anxiety disorder: Secondary | ICD-10-CM | POA: Diagnosis not present

## 2019-08-09 DIAGNOSIS — F411 Generalized anxiety disorder: Secondary | ICD-10-CM | POA: Diagnosis not present

## 2019-08-09 DIAGNOSIS — Z634 Disappearance and death of family member: Secondary | ICD-10-CM | POA: Diagnosis not present

## 2019-08-09 DIAGNOSIS — F332 Major depressive disorder, recurrent severe without psychotic features: Secondary | ICD-10-CM | POA: Diagnosis not present

## 2019-08-09 DIAGNOSIS — F1011 Alcohol abuse, in remission: Secondary | ICD-10-CM | POA: Diagnosis not present

## 2019-08-09 DIAGNOSIS — F4310 Post-traumatic stress disorder, unspecified: Secondary | ICD-10-CM | POA: Diagnosis not present

## 2019-08-09 DIAGNOSIS — F17201 Nicotine dependence, unspecified, in remission: Secondary | ICD-10-CM | POA: Diagnosis not present

## 2019-08-21 DIAGNOSIS — M25569 Pain in unspecified knee: Secondary | ICD-10-CM | POA: Diagnosis not present

## 2019-08-21 DIAGNOSIS — M25559 Pain in unspecified hip: Secondary | ICD-10-CM | POA: Diagnosis not present

## 2019-08-21 DIAGNOSIS — R42 Dizziness and giddiness: Secondary | ICD-10-CM | POA: Diagnosis not present

## 2019-08-21 DIAGNOSIS — R2689 Other abnormalities of gait and mobility: Secondary | ICD-10-CM | POA: Diagnosis not present

## 2019-08-21 DIAGNOSIS — M545 Low back pain: Secondary | ICD-10-CM | POA: Diagnosis not present

## 2019-08-23 DIAGNOSIS — F17201 Nicotine dependence, unspecified, in remission: Secondary | ICD-10-CM | POA: Diagnosis not present

## 2019-08-23 DIAGNOSIS — F411 Generalized anxiety disorder: Secondary | ICD-10-CM | POA: Diagnosis not present

## 2019-08-23 DIAGNOSIS — F332 Major depressive disorder, recurrent severe without psychotic features: Secondary | ICD-10-CM | POA: Diagnosis not present

## 2019-08-23 DIAGNOSIS — Z634 Disappearance and death of family member: Secondary | ICD-10-CM | POA: Diagnosis not present

## 2019-08-23 DIAGNOSIS — F1011 Alcohol abuse, in remission: Secondary | ICD-10-CM | POA: Diagnosis not present

## 2019-08-23 DIAGNOSIS — F4310 Post-traumatic stress disorder, unspecified: Secondary | ICD-10-CM | POA: Diagnosis not present

## 2019-09-17 DIAGNOSIS — F411 Generalized anxiety disorder: Secondary | ICD-10-CM | POA: Diagnosis not present

## 2019-09-17 DIAGNOSIS — F332 Major depressive disorder, recurrent severe without psychotic features: Secondary | ICD-10-CM | POA: Diagnosis not present

## 2019-09-20 DIAGNOSIS — F17201 Nicotine dependence, unspecified, in remission: Secondary | ICD-10-CM | POA: Diagnosis not present

## 2019-09-20 DIAGNOSIS — F4310 Post-traumatic stress disorder, unspecified: Secondary | ICD-10-CM | POA: Diagnosis not present

## 2019-09-20 DIAGNOSIS — Z634 Disappearance and death of family member: Secondary | ICD-10-CM | POA: Diagnosis not present

## 2019-09-20 DIAGNOSIS — F332 Major depressive disorder, recurrent severe without psychotic features: Secondary | ICD-10-CM | POA: Diagnosis not present

## 2019-09-20 DIAGNOSIS — F411 Generalized anxiety disorder: Secondary | ICD-10-CM | POA: Diagnosis not present

## 2019-09-20 DIAGNOSIS — F1011 Alcohol abuse, in remission: Secondary | ICD-10-CM | POA: Diagnosis not present

## 2019-10-02 DIAGNOSIS — E119 Type 2 diabetes mellitus without complications: Secondary | ICD-10-CM | POA: Diagnosis not present

## 2019-10-02 DIAGNOSIS — I10 Essential (primary) hypertension: Secondary | ICD-10-CM | POA: Diagnosis not present

## 2019-10-11 DIAGNOSIS — F17201 Nicotine dependence, unspecified, in remission: Secondary | ICD-10-CM | POA: Diagnosis not present

## 2019-10-11 DIAGNOSIS — F4310 Post-traumatic stress disorder, unspecified: Secondary | ICD-10-CM | POA: Diagnosis not present

## 2019-10-11 DIAGNOSIS — Z634 Disappearance and death of family member: Secondary | ICD-10-CM | POA: Diagnosis not present

## 2019-10-11 DIAGNOSIS — F411 Generalized anxiety disorder: Secondary | ICD-10-CM | POA: Diagnosis not present

## 2019-10-11 DIAGNOSIS — F332 Major depressive disorder, recurrent severe without psychotic features: Secondary | ICD-10-CM | POA: Diagnosis not present

## 2019-10-11 DIAGNOSIS — F1011 Alcohol abuse, in remission: Secondary | ICD-10-CM | POA: Diagnosis not present

## 2019-11-05 DIAGNOSIS — F419 Anxiety disorder, unspecified: Secondary | ICD-10-CM | POA: Diagnosis not present

## 2019-11-05 DIAGNOSIS — G5600 Carpal tunnel syndrome, unspecified upper limb: Secondary | ICD-10-CM | POA: Diagnosis not present

## 2019-11-05 DIAGNOSIS — E785 Hyperlipidemia, unspecified: Secondary | ICD-10-CM | POA: Diagnosis not present

## 2019-11-05 DIAGNOSIS — Z8679 Personal history of other diseases of the circulatory system: Secondary | ICD-10-CM | POA: Diagnosis not present

## 2019-11-05 DIAGNOSIS — M545 Low back pain: Secondary | ICD-10-CM | POA: Diagnosis not present

## 2019-11-05 DIAGNOSIS — R519 Headache, unspecified: Secondary | ICD-10-CM | POA: Diagnosis not present

## 2019-11-05 DIAGNOSIS — G4733 Obstructive sleep apnea (adult) (pediatric): Secondary | ICD-10-CM | POA: Diagnosis not present

## 2019-11-05 DIAGNOSIS — R269 Unspecified abnormalities of gait and mobility: Secondary | ICD-10-CM | POA: Diagnosis not present

## 2019-11-05 DIAGNOSIS — G43909 Migraine, unspecified, not intractable, without status migrainosus: Secondary | ICD-10-CM | POA: Diagnosis not present

## 2019-11-05 DIAGNOSIS — G4700 Insomnia, unspecified: Secondary | ICD-10-CM | POA: Diagnosis not present

## 2019-11-05 DIAGNOSIS — R569 Unspecified convulsions: Secondary | ICD-10-CM | POA: Diagnosis not present

## 2019-11-05 DIAGNOSIS — R42 Dizziness and giddiness: Secondary | ICD-10-CM | POA: Diagnosis not present

## 2019-11-23 IMAGING — MR MRI LUMBAR SPINE WITHOUT CONTRAST
4 of 5 series · 16 of 48 positions shown · non-contrast
Comparison: CT abdomen 03/11/2013, 10/25/2015

CLINICAL DATA: Low back pain AND difficulty walking x 3 weeks, nki,
states a hx of prior 3rd lumbar vertebrae fx

EXAM:
MRI LUMBAR SPINE WITHOUT CONTRAST
TECHNIQUE: Multiplanar, multisequence MR imaging of the lumbar spine was
performed. No intravenous contrast was administered.

[Series 3: T2 · sagittal · 4.0mm · 0.81mm/px · 6 of 15 slices shown (1 of 2)]
[im 1/15]
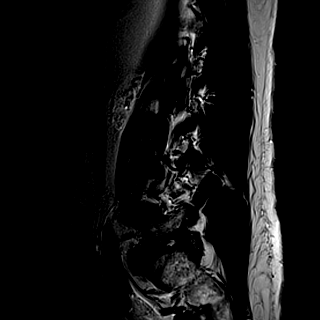
[im 3/15]
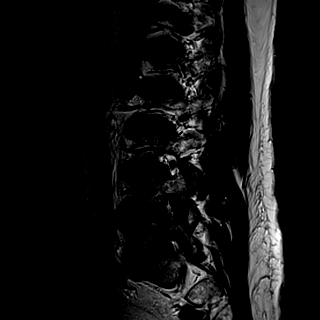
[im 6/15]
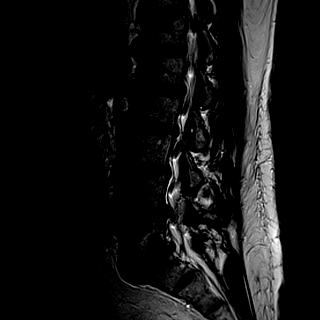
[im 9/15]
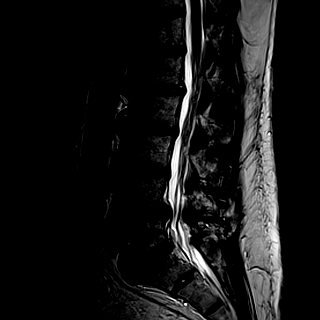
[im 12/15]
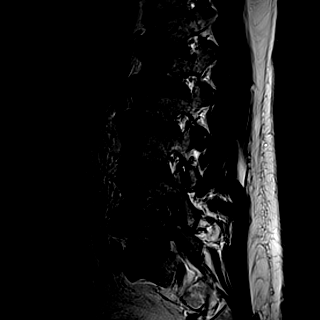
[im 15/15]
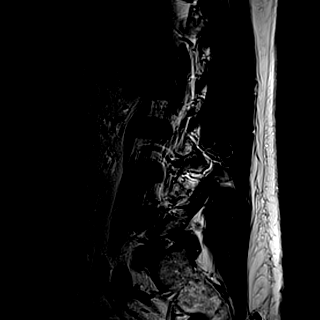

[Series 4: T1 · sagittal · 4.0mm · 0.34mm/px · 3 of 15 slices shown (1 of 2)]
[im 3/15]
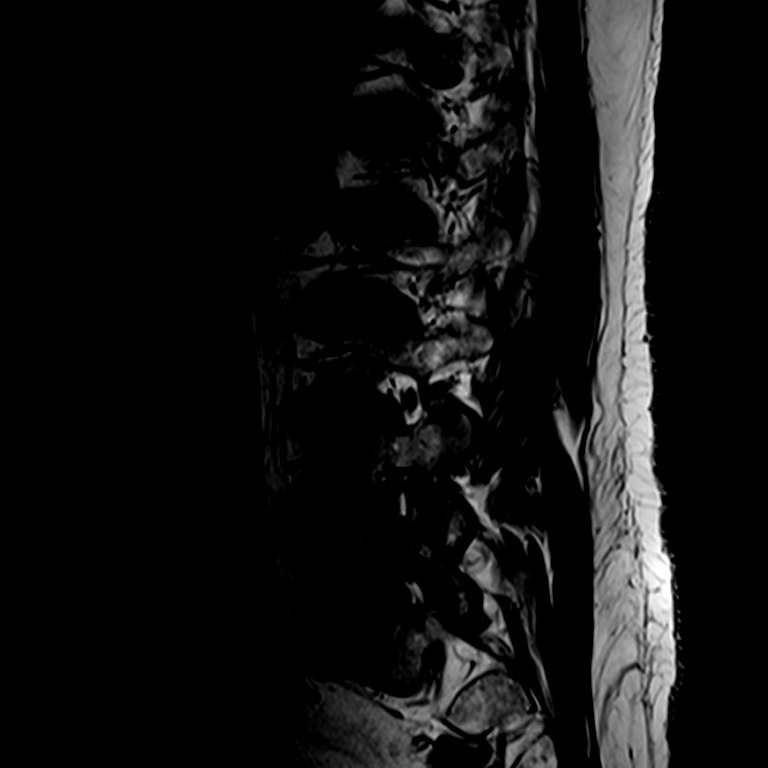
[im 9/15]
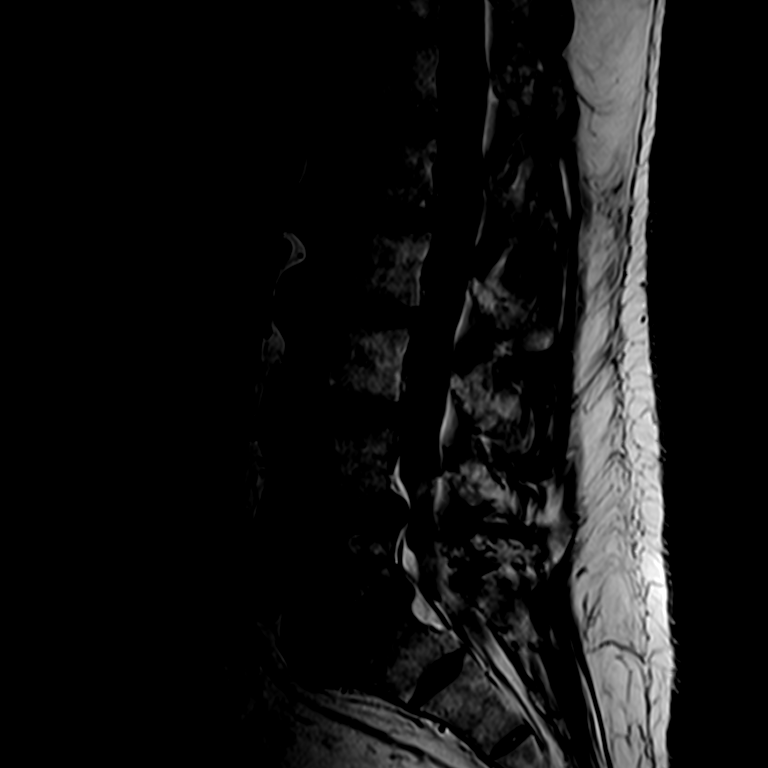
[im 15/15]
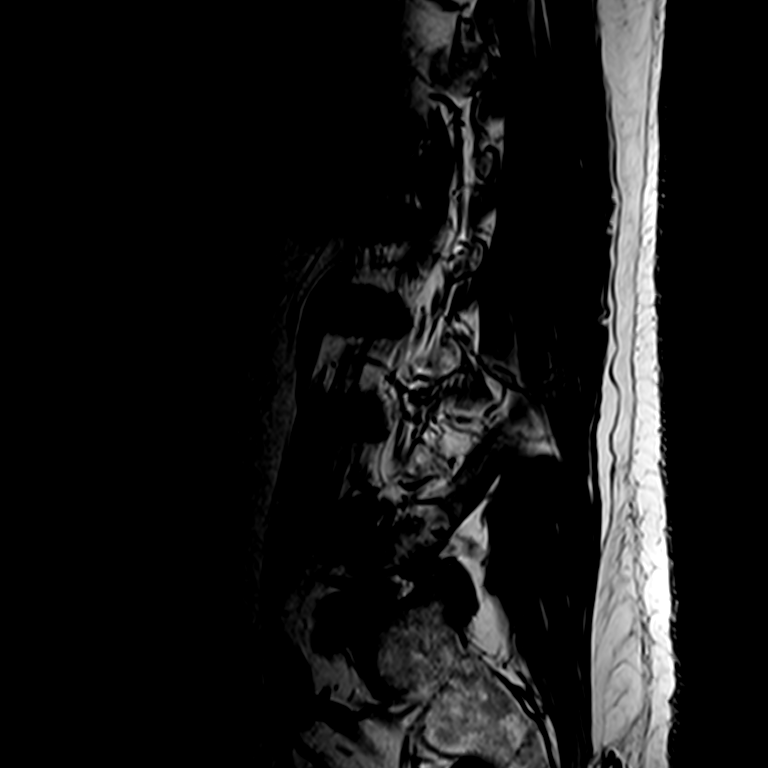

[Series 6: T2 · axial · 4.0mm · 0.26mm/px · z∈[-85,+79]mm · 4 of 41 slices shown (2 of 2)]
[im 1/41]
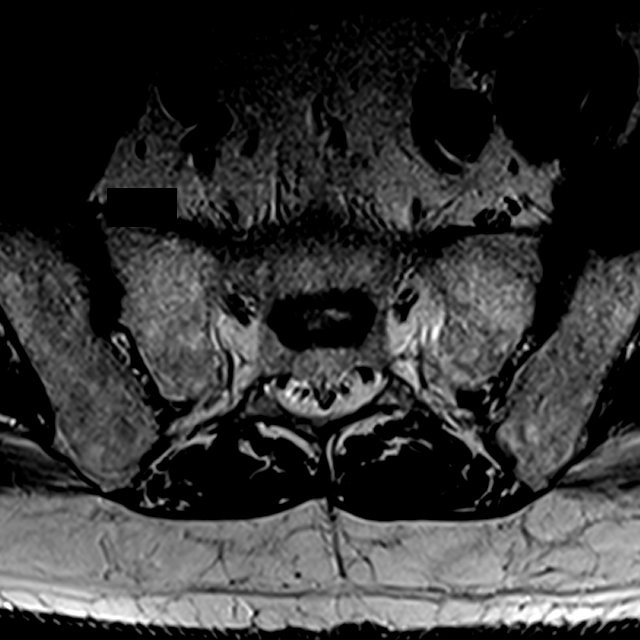
[im 6/41]
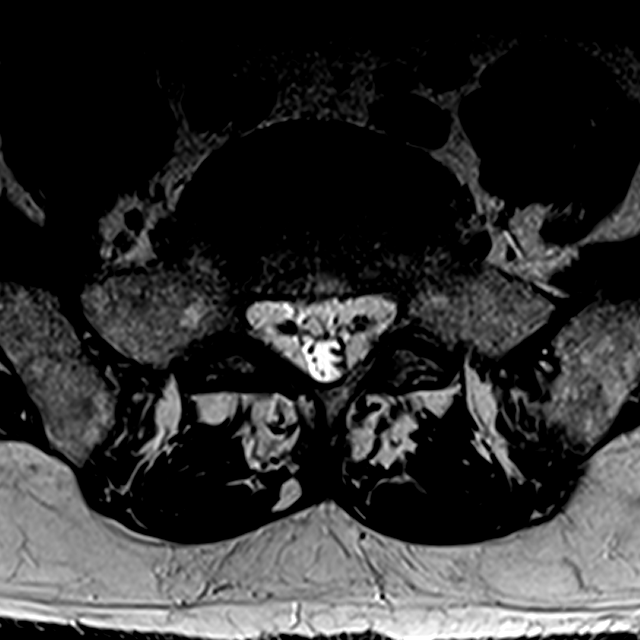
[im 21/41]
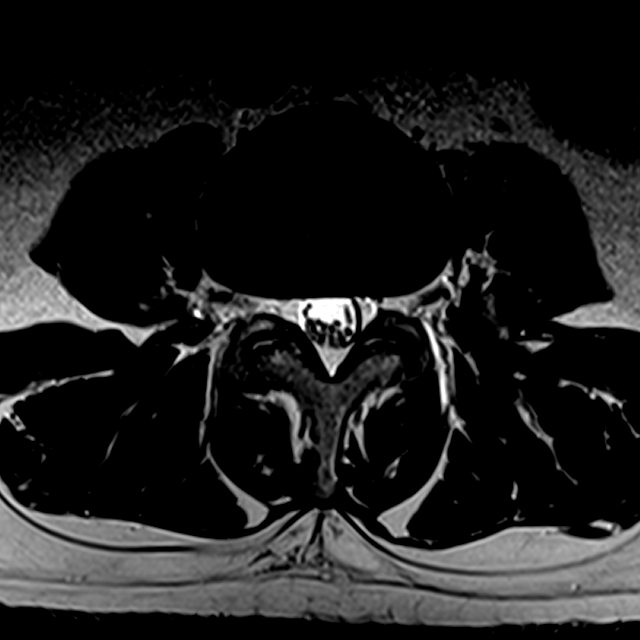
[im 35/41]
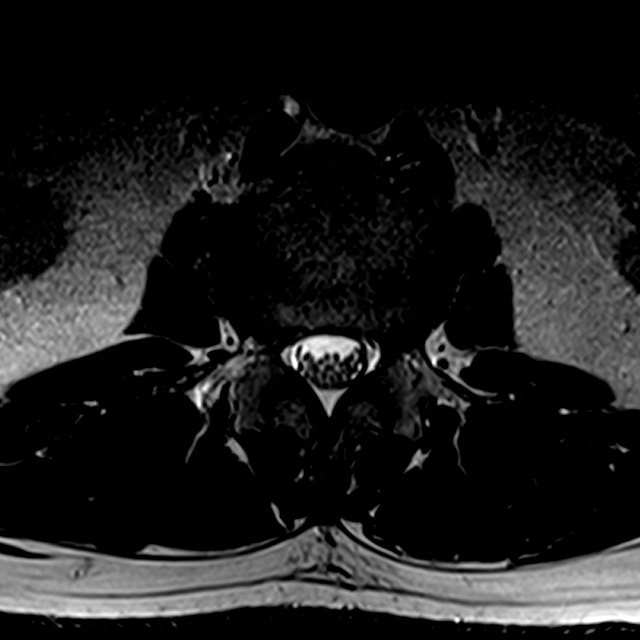

[Series 7: T1 · axial · 4.0mm · 0.28mm/px · z∈[-65,+79]mm · 3 of 41 slices shown (2 of 2)]
[im 6/41]
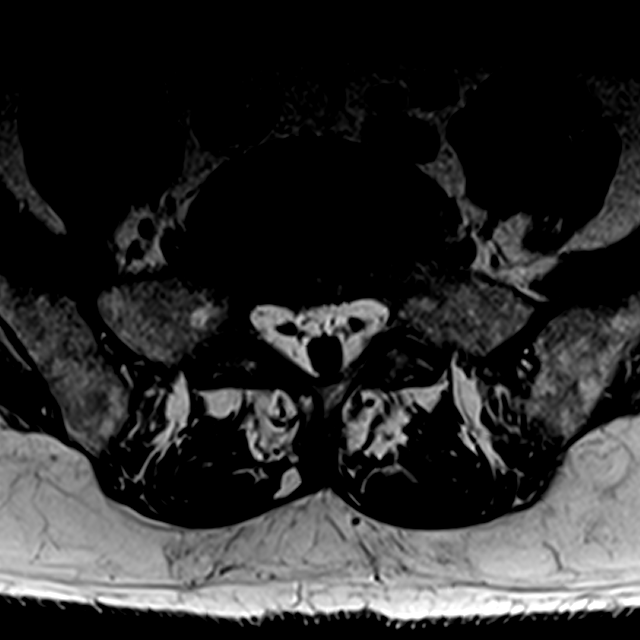
[im 21/41]
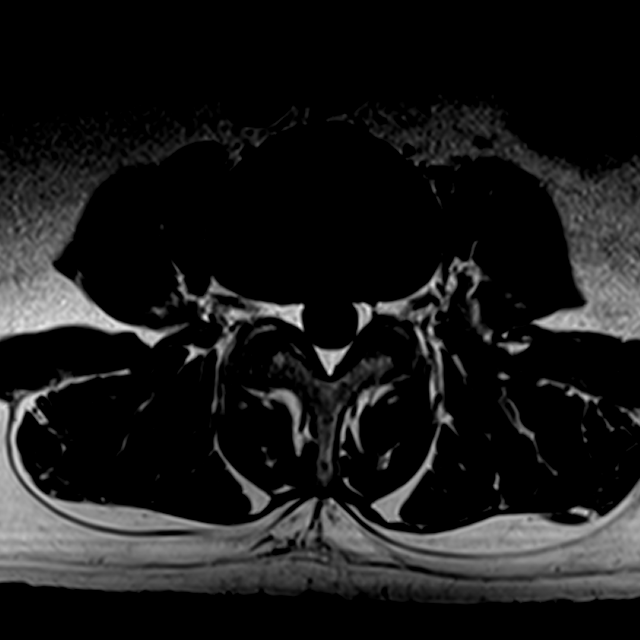
[im 35/41]
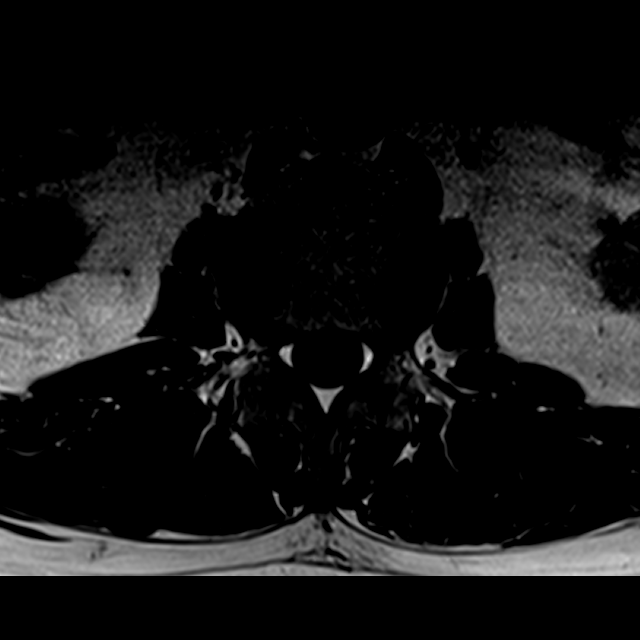

[16 of 48 positions shown; findings below may reference images not displayed]

FINDINGS: Segmentation:  Standard.

Alignment:  Physiologic.

Vertebrae: No acute fracture, evidence of discitis, or bone lesion.
Chronic T12 vertebral body compression fracture.

Conus medullaris and cauda equina: Conus extends to the T12 level.
Conus and cauda equina appear normal.

Paraspinal and other soft tissues: No acute paraspinal abnormality.

Disc levels:

Disc spaces: Degenerative disease with disc height loss at T12-L1,
L1-2, L2-3, L3-4, L4-5 and L5-S1. reactive endplate changes at L4-5
and L5-S1.

T12-L1: Mild broad-based disc bulge. No evidence of neural foraminal
stenosis. No central canal stenosis.

L1-L2: Mild broad-based disc bulge. No evidence of neural foraminal
stenosis. No central canal stenosis.

L2-L3: Mild broad-based disc bulge. No evidence of neural foraminal
stenosis. No central canal stenosis.

L3-L4: Mild broad-based disc bulge. Mild bilateral facet
arthropathy. No evidence of neural foraminal stenosis. No central
canal stenosis.

L4-L5: Broad-based disc bulge. Moderate bilateral facet arthropathy.
Moderate left and mild right foraminal narrowing. No central canal
stenosis.

L5-S1: Broad-based disc bulge. Moderate bilateral facet arthropathy.
Moderate bilateral foraminal stenosis. No evidence of neural
foraminal stenosis. No central canal stenosis.
IMPRESSION: 1. Diffuse lumbar spine spondylosis as described above.
2.  No acute osseous injury of the lumbar spine.

## 2019-12-11 DIAGNOSIS — F332 Major depressive disorder, recurrent severe without psychotic features: Secondary | ICD-10-CM | POA: Diagnosis not present

## 2019-12-11 DIAGNOSIS — F17201 Nicotine dependence, unspecified, in remission: Secondary | ICD-10-CM | POA: Diagnosis not present

## 2019-12-11 DIAGNOSIS — Z634 Disappearance and death of family member: Secondary | ICD-10-CM | POA: Diagnosis not present

## 2019-12-11 DIAGNOSIS — F1011 Alcohol abuse, in remission: Secondary | ICD-10-CM | POA: Diagnosis not present

## 2019-12-11 DIAGNOSIS — F411 Generalized anxiety disorder: Secondary | ICD-10-CM | POA: Diagnosis not present

## 2019-12-11 DIAGNOSIS — F4310 Post-traumatic stress disorder, unspecified: Secondary | ICD-10-CM | POA: Diagnosis not present

## 2019-12-19 DIAGNOSIS — Z634 Disappearance and death of family member: Secondary | ICD-10-CM | POA: Diagnosis not present

## 2019-12-19 DIAGNOSIS — F411 Generalized anxiety disorder: Secondary | ICD-10-CM | POA: Diagnosis not present

## 2019-12-19 DIAGNOSIS — F332 Major depressive disorder, recurrent severe without psychotic features: Secondary | ICD-10-CM | POA: Diagnosis not present

## 2019-12-19 DIAGNOSIS — F1011 Alcohol abuse, in remission: Secondary | ICD-10-CM | POA: Diagnosis not present

## 2019-12-19 DIAGNOSIS — F17201 Nicotine dependence, unspecified, in remission: Secondary | ICD-10-CM | POA: Diagnosis not present

## 2019-12-19 DIAGNOSIS — F4310 Post-traumatic stress disorder, unspecified: Secondary | ICD-10-CM | POA: Diagnosis not present

## 2020-01-02 DIAGNOSIS — F4310 Post-traumatic stress disorder, unspecified: Secondary | ICD-10-CM | POA: Diagnosis not present

## 2020-01-02 DIAGNOSIS — F332 Major depressive disorder, recurrent severe without psychotic features: Secondary | ICD-10-CM | POA: Diagnosis not present

## 2020-01-02 DIAGNOSIS — Z634 Disappearance and death of family member: Secondary | ICD-10-CM | POA: Diagnosis not present

## 2020-01-02 DIAGNOSIS — F411 Generalized anxiety disorder: Secondary | ICD-10-CM | POA: Diagnosis not present

## 2020-01-22 DIAGNOSIS — R109 Unspecified abdominal pain: Secondary | ICD-10-CM | POA: Diagnosis not present

## 2020-01-22 DIAGNOSIS — M179 Osteoarthritis of knee, unspecified: Secondary | ICD-10-CM | POA: Diagnosis not present

## 2020-01-22 DIAGNOSIS — K581 Irritable bowel syndrome with constipation: Secondary | ICD-10-CM | POA: Diagnosis not present

## 2020-01-22 DIAGNOSIS — I10 Essential (primary) hypertension: Secondary | ICD-10-CM | POA: Diagnosis not present

## 2020-01-22 DIAGNOSIS — Z79899 Other long term (current) drug therapy: Secondary | ICD-10-CM | POA: Diagnosis not present

## 2020-01-22 DIAGNOSIS — N200 Calculus of kidney: Secondary | ICD-10-CM | POA: Diagnosis not present

## 2020-01-22 DIAGNOSIS — M169 Osteoarthritis of hip, unspecified: Secondary | ICD-10-CM | POA: Diagnosis not present

## 2020-01-29 DIAGNOSIS — F411 Generalized anxiety disorder: Secondary | ICD-10-CM | POA: Diagnosis not present

## 2020-01-29 DIAGNOSIS — F332 Major depressive disorder, recurrent severe without psychotic features: Secondary | ICD-10-CM | POA: Diagnosis not present

## 2020-01-29 DIAGNOSIS — F1011 Alcohol abuse, in remission: Secondary | ICD-10-CM | POA: Diagnosis not present

## 2020-01-29 DIAGNOSIS — F4310 Post-traumatic stress disorder, unspecified: Secondary | ICD-10-CM | POA: Diagnosis not present

## 2020-01-29 DIAGNOSIS — F17201 Nicotine dependence, unspecified, in remission: Secondary | ICD-10-CM | POA: Diagnosis not present

## 2020-01-29 DIAGNOSIS — Z634 Disappearance and death of family member: Secondary | ICD-10-CM | POA: Diagnosis not present

## 2020-01-30 DIAGNOSIS — F4310 Post-traumatic stress disorder, unspecified: Secondary | ICD-10-CM | POA: Diagnosis not present

## 2020-01-30 DIAGNOSIS — F411 Generalized anxiety disorder: Secondary | ICD-10-CM | POA: Diagnosis not present

## 2020-01-30 DIAGNOSIS — Z634 Disappearance and death of family member: Secondary | ICD-10-CM | POA: Diagnosis not present

## 2020-01-30 DIAGNOSIS — F332 Major depressive disorder, recurrent severe without psychotic features: Secondary | ICD-10-CM | POA: Diagnosis not present

## 2020-01-31 ENCOUNTER — Other Ambulatory Visit (HOSPITAL_COMMUNITY): Payer: Self-pay | Admitting: Neurology

## 2020-01-31 ENCOUNTER — Other Ambulatory Visit: Payer: Self-pay

## 2020-01-31 ENCOUNTER — Ambulatory Visit (HOSPITAL_COMMUNITY)
Admission: RE | Admit: 2020-01-31 | Discharge: 2020-01-31 | Disposition: A | Discharge status: Home / Self Care | Payer: PPO | Source: Ambulatory Visit | Attending: Neurology | Admitting: Neurology

## 2020-01-31 DIAGNOSIS — M25551 Pain in right hip: Secondary | ICD-10-CM | POA: Diagnosis not present

## 2020-01-31 DIAGNOSIS — M545 Low back pain: Secondary | ICD-10-CM | POA: Diagnosis not present

## 2020-01-31 DIAGNOSIS — F419 Anxiety disorder, unspecified: Secondary | ICD-10-CM | POA: Diagnosis not present

## 2020-01-31 DIAGNOSIS — E785 Hyperlipidemia, unspecified: Secondary | ICD-10-CM | POA: Diagnosis not present

## 2020-01-31 DIAGNOSIS — G4700 Insomnia, unspecified: Secondary | ICD-10-CM | POA: Diagnosis not present

## 2020-01-31 DIAGNOSIS — M25552 Pain in left hip: Secondary | ICD-10-CM

## 2020-01-31 DIAGNOSIS — R569 Unspecified convulsions: Secondary | ICD-10-CM | POA: Diagnosis not present

## 2020-01-31 DIAGNOSIS — R519 Headache, unspecified: Secondary | ICD-10-CM | POA: Diagnosis not present

## 2020-01-31 DIAGNOSIS — Z8679 Personal history of other diseases of the circulatory system: Secondary | ICD-10-CM | POA: Diagnosis not present

## 2020-01-31 DIAGNOSIS — R269 Unspecified abnormalities of gait and mobility: Secondary | ICD-10-CM | POA: Diagnosis not present

## 2020-01-31 DIAGNOSIS — M160 Bilateral primary osteoarthritis of hip: Secondary | ICD-10-CM | POA: Diagnosis not present

## 2020-01-31 DIAGNOSIS — G5600 Carpal tunnel syndrome, unspecified upper limb: Secondary | ICD-10-CM | POA: Diagnosis not present

## 2020-01-31 DIAGNOSIS — R42 Dizziness and giddiness: Secondary | ICD-10-CM | POA: Diagnosis not present

## 2020-02-19 DIAGNOSIS — G4733 Obstructive sleep apnea (adult) (pediatric): Secondary | ICD-10-CM | POA: Diagnosis not present

## 2020-02-19 DIAGNOSIS — M25552 Pain in left hip: Secondary | ICD-10-CM | POA: Diagnosis not present

## 2020-02-19 DIAGNOSIS — G5600 Carpal tunnel syndrome, unspecified upper limb: Secondary | ICD-10-CM | POA: Diagnosis not present

## 2020-02-19 DIAGNOSIS — M545 Low back pain: Secondary | ICD-10-CM | POA: Diagnosis not present

## 2020-02-19 DIAGNOSIS — R269 Unspecified abnormalities of gait and mobility: Secondary | ICD-10-CM | POA: Diagnosis not present

## 2020-02-19 DIAGNOSIS — R519 Headache, unspecified: Secondary | ICD-10-CM | POA: Diagnosis not present

## 2020-02-19 DIAGNOSIS — E785 Hyperlipidemia, unspecified: Secondary | ICD-10-CM | POA: Diagnosis not present

## 2020-02-19 DIAGNOSIS — M25551 Pain in right hip: Secondary | ICD-10-CM | POA: Diagnosis not present

## 2020-02-19 DIAGNOSIS — G4700 Insomnia, unspecified: Secondary | ICD-10-CM | POA: Diagnosis not present

## 2020-02-19 DIAGNOSIS — F419 Anxiety disorder, unspecified: Secondary | ICD-10-CM | POA: Diagnosis not present

## 2020-02-19 DIAGNOSIS — Z8679 Personal history of other diseases of the circulatory system: Secondary | ICD-10-CM | POA: Diagnosis not present

## 2020-02-19 DIAGNOSIS — R42 Dizziness and giddiness: Secondary | ICD-10-CM | POA: Diagnosis not present

## 2020-03-01 DIAGNOSIS — G4730 Sleep apnea, unspecified: Secondary | ICD-10-CM | POA: Diagnosis not present

## 2020-03-11 DIAGNOSIS — M1711 Unilateral primary osteoarthritis, right knee: Secondary | ICD-10-CM | POA: Diagnosis not present

## 2020-03-11 DIAGNOSIS — M25551 Pain in right hip: Secondary | ICD-10-CM | POA: Diagnosis not present

## 2020-03-11 DIAGNOSIS — M1712 Unilateral primary osteoarthritis, left knee: Secondary | ICD-10-CM | POA: Diagnosis not present

## 2020-03-11 DIAGNOSIS — M25552 Pain in left hip: Secondary | ICD-10-CM | POA: Diagnosis not present

## 2020-03-11 DIAGNOSIS — Z6841 Body Mass Index (BMI) 40.0 and over, adult: Secondary | ICD-10-CM | POA: Diagnosis not present

## 2020-03-17 DIAGNOSIS — M25551 Pain in right hip: Secondary | ICD-10-CM | POA: Diagnosis not present

## 2020-03-17 DIAGNOSIS — M25552 Pain in left hip: Secondary | ICD-10-CM | POA: Diagnosis not present

## 2020-03-25 DIAGNOSIS — Z20828 Contact with and (suspected) exposure to other viral communicable diseases: Secondary | ICD-10-CM | POA: Diagnosis not present

## 2020-04-01 DIAGNOSIS — Z79899 Other long term (current) drug therapy: Secondary | ICD-10-CM | POA: Diagnosis not present

## 2020-04-01 DIAGNOSIS — M170 Bilateral primary osteoarthritis of knee: Secondary | ICD-10-CM | POA: Diagnosis not present

## 2020-04-01 DIAGNOSIS — I10 Essential (primary) hypertension: Secondary | ICD-10-CM | POA: Diagnosis not present

## 2020-04-01 DIAGNOSIS — M179 Osteoarthritis of knee, unspecified: Secondary | ICD-10-CM | POA: Diagnosis not present

## 2020-04-16 DIAGNOSIS — M25552 Pain in left hip: Secondary | ICD-10-CM | POA: Diagnosis not present

## 2020-04-16 DIAGNOSIS — M25551 Pain in right hip: Secondary | ICD-10-CM | POA: Diagnosis not present

## 2020-04-23 DIAGNOSIS — G4733 Obstructive sleep apnea (adult) (pediatric): Secondary | ICD-10-CM | POA: Diagnosis not present

## 2020-04-24 DIAGNOSIS — G4700 Insomnia, unspecified: Secondary | ICD-10-CM | POA: Diagnosis not present

## 2020-04-24 DIAGNOSIS — R42 Dizziness and giddiness: Secondary | ICD-10-CM | POA: Diagnosis not present

## 2020-04-24 DIAGNOSIS — G4733 Obstructive sleep apnea (adult) (pediatric): Secondary | ICD-10-CM | POA: Diagnosis not present

## 2020-04-24 DIAGNOSIS — F419 Anxiety disorder, unspecified: Secondary | ICD-10-CM | POA: Diagnosis not present

## 2020-04-24 DIAGNOSIS — R269 Unspecified abnormalities of gait and mobility: Secondary | ICD-10-CM | POA: Diagnosis not present

## 2020-04-24 DIAGNOSIS — G5600 Carpal tunnel syndrome, unspecified upper limb: Secondary | ICD-10-CM | POA: Diagnosis not present

## 2020-04-24 DIAGNOSIS — E785 Hyperlipidemia, unspecified: Secondary | ICD-10-CM | POA: Diagnosis not present

## 2020-04-24 DIAGNOSIS — M25552 Pain in left hip: Secondary | ICD-10-CM | POA: Diagnosis not present

## 2020-04-24 DIAGNOSIS — M25551 Pain in right hip: Secondary | ICD-10-CM | POA: Diagnosis not present

## 2020-04-24 DIAGNOSIS — M545 Low back pain: Secondary | ICD-10-CM | POA: Diagnosis not present

## 2020-04-24 DIAGNOSIS — Z8679 Personal history of other diseases of the circulatory system: Secondary | ICD-10-CM | POA: Diagnosis not present

## 2020-04-24 DIAGNOSIS — R519 Headache, unspecified: Secondary | ICD-10-CM | POA: Diagnosis not present

## 2020-05-23 DIAGNOSIS — G4733 Obstructive sleep apnea (adult) (pediatric): Secondary | ICD-10-CM | POA: Diagnosis not present

## 2020-06-02 DIAGNOSIS — G4733 Obstructive sleep apnea (adult) (pediatric): Secondary | ICD-10-CM | POA: Diagnosis not present

## 2020-06-23 DIAGNOSIS — G4733 Obstructive sleep apnea (adult) (pediatric): Secondary | ICD-10-CM | POA: Diagnosis not present

## 2020-07-16 DIAGNOSIS — R569 Unspecified convulsions: Secondary | ICD-10-CM | POA: Diagnosis not present

## 2020-07-16 DIAGNOSIS — R42 Dizziness and giddiness: Secondary | ICD-10-CM | POA: Diagnosis not present

## 2020-07-16 DIAGNOSIS — G4733 Obstructive sleep apnea (adult) (pediatric): Secondary | ICD-10-CM | POA: Diagnosis not present

## 2020-07-16 DIAGNOSIS — R269 Unspecified abnormalities of gait and mobility: Secondary | ICD-10-CM | POA: Diagnosis not present

## 2020-07-16 DIAGNOSIS — M25552 Pain in left hip: Secondary | ICD-10-CM | POA: Diagnosis not present

## 2020-07-16 DIAGNOSIS — G5600 Carpal tunnel syndrome, unspecified upper limb: Secondary | ICD-10-CM | POA: Diagnosis not present

## 2020-07-16 DIAGNOSIS — F419 Anxiety disorder, unspecified: Secondary | ICD-10-CM | POA: Diagnosis not present

## 2020-07-16 DIAGNOSIS — R519 Headache, unspecified: Secondary | ICD-10-CM | POA: Diagnosis not present

## 2020-07-16 DIAGNOSIS — Z8679 Personal history of other diseases of the circulatory system: Secondary | ICD-10-CM | POA: Diagnosis not present

## 2020-07-16 DIAGNOSIS — G43909 Migraine, unspecified, not intractable, without status migrainosus: Secondary | ICD-10-CM | POA: Diagnosis not present

## 2020-07-16 DIAGNOSIS — M25551 Pain in right hip: Secondary | ICD-10-CM | POA: Diagnosis not present

## 2020-07-16 DIAGNOSIS — M545 Low back pain, unspecified: Secondary | ICD-10-CM | POA: Diagnosis not present

## 2020-07-23 DIAGNOSIS — G4733 Obstructive sleep apnea (adult) (pediatric): Secondary | ICD-10-CM | POA: Diagnosis not present

## 2020-07-31 DIAGNOSIS — M179 Osteoarthritis of knee, unspecified: Secondary | ICD-10-CM | POA: Diagnosis not present

## 2020-07-31 DIAGNOSIS — Z79899 Other long term (current) drug therapy: Secondary | ICD-10-CM | POA: Diagnosis not present

## 2020-07-31 DIAGNOSIS — E119 Type 2 diabetes mellitus without complications: Secondary | ICD-10-CM | POA: Diagnosis not present

## 2020-07-31 DIAGNOSIS — I10 Essential (primary) hypertension: Secondary | ICD-10-CM | POA: Diagnosis not present

## 2020-07-31 DIAGNOSIS — M2392 Unspecified internal derangement of left knee: Secondary | ICD-10-CM | POA: Diagnosis not present

## 2020-07-31 DIAGNOSIS — M1712 Unilateral primary osteoarthritis, left knee: Secondary | ICD-10-CM | POA: Diagnosis not present

## 2020-07-31 DIAGNOSIS — E1140 Type 2 diabetes mellitus with diabetic neuropathy, unspecified: Secondary | ICD-10-CM | POA: Diagnosis not present

## 2020-08-23 DIAGNOSIS — G4733 Obstructive sleep apnea (adult) (pediatric): Secondary | ICD-10-CM | POA: Diagnosis not present

## 2020-08-26 DIAGNOSIS — M1712 Unilateral primary osteoarthritis, left knee: Secondary | ICD-10-CM | POA: Diagnosis not present

## 2020-08-26 DIAGNOSIS — M25561 Pain in right knee: Secondary | ICD-10-CM | POA: Diagnosis not present

## 2020-08-26 DIAGNOSIS — M1711 Unilateral primary osteoarthritis, right knee: Secondary | ICD-10-CM | POA: Diagnosis not present

## 2020-08-26 DIAGNOSIS — Z23 Encounter for immunization: Secondary | ICD-10-CM | POA: Diagnosis not present

## 2020-08-26 DIAGNOSIS — R0600 Dyspnea, unspecified: Secondary | ICD-10-CM | POA: Diagnosis not present

## 2020-08-26 DIAGNOSIS — M549 Dorsalgia, unspecified: Secondary | ICD-10-CM | POA: Diagnosis not present

## 2020-08-26 DIAGNOSIS — M25562 Pain in left knee: Secondary | ICD-10-CM | POA: Diagnosis not present

## 2020-09-09 DIAGNOSIS — E119 Type 2 diabetes mellitus without complications: Secondary | ICD-10-CM | POA: Diagnosis not present

## 2020-09-09 DIAGNOSIS — K219 Gastro-esophageal reflux disease without esophagitis: Secondary | ICD-10-CM | POA: Diagnosis not present

## 2020-09-09 DIAGNOSIS — M25561 Pain in right knee: Secondary | ICD-10-CM | POA: Diagnosis not present

## 2020-09-09 DIAGNOSIS — Z1389 Encounter for screening for other disorder: Secondary | ICD-10-CM | POA: Diagnosis not present

## 2020-09-09 DIAGNOSIS — I10 Essential (primary) hypertension: Secondary | ICD-10-CM | POA: Diagnosis not present

## 2020-09-09 DIAGNOSIS — E7849 Other hyperlipidemia: Secondary | ICD-10-CM | POA: Diagnosis not present

## 2020-09-09 DIAGNOSIS — Z6839 Body mass index (BMI) 39.0-39.9, adult: Secondary | ICD-10-CM | POA: Diagnosis not present

## 2020-09-09 DIAGNOSIS — E782 Mixed hyperlipidemia: Secondary | ICD-10-CM | POA: Diagnosis not present

## 2020-09-09 DIAGNOSIS — Z1331 Encounter for screening for depression: Secondary | ICD-10-CM | POA: Diagnosis not present

## 2020-09-09 DIAGNOSIS — M25562 Pain in left knee: Secondary | ICD-10-CM | POA: Diagnosis not present

## 2020-09-15 DIAGNOSIS — Z23 Encounter for immunization: Secondary | ICD-10-CM | POA: Diagnosis not present

## 2020-09-17 DIAGNOSIS — Z1211 Encounter for screening for malignant neoplasm of colon: Secondary | ICD-10-CM | POA: Diagnosis not present

## 2020-09-18 DIAGNOSIS — G4733 Obstructive sleep apnea (adult) (pediatric): Secondary | ICD-10-CM | POA: Diagnosis not present

## 2020-09-19 DIAGNOSIS — I10 Essential (primary) hypertension: Secondary | ICD-10-CM | POA: Diagnosis not present

## 2020-09-19 DIAGNOSIS — E785 Hyperlipidemia, unspecified: Secondary | ICD-10-CM | POA: Diagnosis not present

## 2020-09-19 DIAGNOSIS — E119 Type 2 diabetes mellitus without complications: Secondary | ICD-10-CM | POA: Diagnosis not present

## 2020-09-19 DIAGNOSIS — W540XXA Bitten by dog, initial encounter: Secondary | ICD-10-CM | POA: Diagnosis not present

## 2020-09-19 DIAGNOSIS — Z2914 Encounter for prophylactic rabies immune globin: Secondary | ICD-10-CM | POA: Diagnosis not present

## 2020-09-19 DIAGNOSIS — Z203 Contact with and (suspected) exposure to rabies: Secondary | ICD-10-CM | POA: Diagnosis not present

## 2020-09-19 DIAGNOSIS — S91052A Open bite, left ankle, initial encounter: Secondary | ICD-10-CM | POA: Diagnosis not present

## 2020-09-22 DIAGNOSIS — Z203 Contact with and (suspected) exposure to rabies: Secondary | ICD-10-CM | POA: Diagnosis not present

## 2020-09-22 DIAGNOSIS — G4733 Obstructive sleep apnea (adult) (pediatric): Secondary | ICD-10-CM | POA: Diagnosis not present

## 2020-09-23 DIAGNOSIS — Z6838 Body mass index (BMI) 38.0-38.9, adult: Secondary | ICD-10-CM | POA: Diagnosis not present

## 2020-09-23 DIAGNOSIS — M25562 Pain in left knee: Secondary | ICD-10-CM | POA: Diagnosis not present

## 2020-09-23 DIAGNOSIS — M25561 Pain in right knee: Secondary | ICD-10-CM | POA: Diagnosis not present

## 2020-09-23 DIAGNOSIS — K219 Gastro-esophageal reflux disease without esophagitis: Secondary | ICD-10-CM | POA: Diagnosis not present

## 2020-09-23 DIAGNOSIS — E7801 Familial hypercholesterolemia: Secondary | ICD-10-CM | POA: Diagnosis not present

## 2020-09-23 DIAGNOSIS — E1169 Type 2 diabetes mellitus with other specified complication: Secondary | ICD-10-CM | POA: Diagnosis not present

## 2020-09-23 DIAGNOSIS — I10 Essential (primary) hypertension: Secondary | ICD-10-CM | POA: Diagnosis not present

## 2020-09-26 DIAGNOSIS — Z23 Encounter for immunization: Secondary | ICD-10-CM | POA: Diagnosis not present

## 2020-09-26 DIAGNOSIS — Z203 Contact with and (suspected) exposure to rabies: Secondary | ICD-10-CM | POA: Diagnosis not present

## 2020-10-07 DIAGNOSIS — F419 Anxiety disorder, unspecified: Secondary | ICD-10-CM | POA: Diagnosis not present

## 2020-10-07 DIAGNOSIS — E7801 Familial hypercholesterolemia: Secondary | ICD-10-CM | POA: Diagnosis not present

## 2020-10-07 DIAGNOSIS — M25562 Pain in left knee: Secondary | ICD-10-CM | POA: Diagnosis not present

## 2020-10-07 DIAGNOSIS — M25551 Pain in right hip: Secondary | ICD-10-CM | POA: Diagnosis not present

## 2020-10-07 DIAGNOSIS — E1169 Type 2 diabetes mellitus with other specified complication: Secondary | ICD-10-CM | POA: Diagnosis not present

## 2020-10-07 DIAGNOSIS — Z6839 Body mass index (BMI) 39.0-39.9, adult: Secondary | ICD-10-CM | POA: Diagnosis not present

## 2020-10-07 DIAGNOSIS — E785 Hyperlipidemia, unspecified: Secondary | ICD-10-CM | POA: Diagnosis not present

## 2020-10-07 DIAGNOSIS — M545 Low back pain, unspecified: Secondary | ICD-10-CM | POA: Diagnosis not present

## 2020-10-07 DIAGNOSIS — M25561 Pain in right knee: Secondary | ICD-10-CM | POA: Diagnosis not present

## 2020-10-07 DIAGNOSIS — R569 Unspecified convulsions: Secondary | ICD-10-CM | POA: Diagnosis not present

## 2020-10-07 DIAGNOSIS — R269 Unspecified abnormalities of gait and mobility: Secondary | ICD-10-CM | POA: Diagnosis not present

## 2020-10-07 DIAGNOSIS — Z8679 Personal history of other diseases of the circulatory system: Secondary | ICD-10-CM | POA: Diagnosis not present

## 2020-10-07 DIAGNOSIS — R519 Headache, unspecified: Secondary | ICD-10-CM | POA: Diagnosis not present

## 2020-10-07 DIAGNOSIS — K219 Gastro-esophageal reflux disease without esophagitis: Secondary | ICD-10-CM | POA: Diagnosis not present

## 2020-10-07 DIAGNOSIS — G5600 Carpal tunnel syndrome, unspecified upper limb: Secondary | ICD-10-CM | POA: Diagnosis not present

## 2020-10-07 DIAGNOSIS — I10 Essential (primary) hypertension: Secondary | ICD-10-CM | POA: Diagnosis not present

## 2020-10-07 DIAGNOSIS — G4733 Obstructive sleep apnea (adult) (pediatric): Secondary | ICD-10-CM | POA: Diagnosis not present

## 2020-10-07 DIAGNOSIS — R42 Dizziness and giddiness: Secondary | ICD-10-CM | POA: Diagnosis not present

## 2020-10-07 DIAGNOSIS — M25552 Pain in left hip: Secondary | ICD-10-CM | POA: Diagnosis not present

## 2020-10-15 DIAGNOSIS — Z23 Encounter for immunization: Secondary | ICD-10-CM | POA: Diagnosis not present

## 2020-10-16 NOTE — Progress Notes (Unsigned)
Cardiology Office Note  Date: 10/17/2020   ID: Xavier Williams, DOB April 02, 1961, MRN [id number]  PCP:  Xavier Nation, MD  Cardiologist:  Xavier Dolly, MD Electrophysiologist:  None   Chief Complaint: Past due checkup, shortness of breath  History of Present Illness: Xavier Williams is a 60 y.o. male with a history of HTN, DM 2, HLD, obesity, chest pain, sleep apnea.   Last seen by Dr. Harl Williams 08/22/2018 with complaints of shortness of breath.  PFTs were normal.  Symptoms were thought to be out of proportion to his lung disease according to pulmonary.  Echo January 2020 with EF 60 to 65%, no WMA's, G1 DD.  Had been experiencing shortness of breath for a few years gradually worsening.  Experiencing some sharp pain mid chest could occur at rest with diaphoresis and nausea.  Lasting a few seconds.  Proximal 1-2 episodes per month.  Had remote cath and remote stress test.  Former tobacco smoker x30 years.  He was followed by Dr. Luan Williams for COPD.  His HCTZ was changed to Lasix 40 mg daily.  Based on body habitus Dr. Harl Williams thought nuclear imaging and echo would be limited.  Coronary CT was ordered.  CAC score of 11.5 Agatston units placed  the patient in 68 percentile for age and gender.  Suggesting intermediate risk for future cardiac events.  Nonobstructive mild CAD.  He is here today for follow-up with his wife.  States he has been having some shortness of breath but this is chronic due to his COPD.  He denies any recent anginal or exertional symptoms other than some fleeting chest pain without any radiation.  No associated symptoms.  Occurs mostly at rest.  States she had Covid back in 2019 and has had some increased breathing difficulty since then.  He has obstructive sleep apnea and uses CPAP machine.  He states his CPAP machine does not seem to help with his sleep.  States he is constantly moving around and restless.  Waking up multiple times per night.  Denies any palpitations or arrhythmias,  orthostatic symptoms, CVA or TIA-like symptoms, denies any claudication-like symptoms, DVT or PE-like symptoms, or lower extremity edema.  States his blood sugars reasonably well controlled.  He is seeing Dr. Jimmye Williams at [facility] and has pending lab work to be done ordered by Dr. Jimmye Williams.  Past Medical History:  Diagnosis Date  . Chronic back pain   . DDD (degenerative disc disease), lumbar   . Depression   . Diabetes mellitus   . Hyperlipidemia   . Hypertension   . Kidney stones   . Schizophrenia (Xavier Williams)   . Sleep apnea     Past Surgical History:  Procedure Laterality Date  . CARDIAC CATHETERIZATION  2005   normal coronary arteries  . COLONOSCOPY      Current Outpatient Medications  Medication Sig Dispense Refill  . albuterol (PROVENTIL HFA;VENTOLIN HFA) 108 (90 Base) MCG/ACT inhaler Inhale 2 puffs into the lungs every 6 (six) hours as needed for wheezing or shortness of breath.    . allopurinol (ZYLOPRIM) 100 MG tablet Take 100 mg by mouth daily.     Jearl Klinefelter ELLIPTA 62.5-25 MCG/INH AEPB Inhale 1 puff into the lungs daily.    . APTIOM 400 MG TABS Take 1 tablet by mouth daily.    Marland Kitchen aspirin EC 81 MG tablet Take 81 mg by mouth every morning.    Marland Kitchen aspirin-acetaminophen-caffeine (EXCEDRIN MIGRAINE) 250-250-65 MG tablet Take 2 tablets  by mouth every 6 (six) hours as needed for headache.    Marland Kitchen atorvastatin (LIPITOR) 40 MG tablet Take 40 mg by mouth daily.    . Blood Glucose Monitoring Suppl (ACCU-CHEK GUIDE ME) w/Device KIT 1 Piece by Does not apply route as directed. 1 kit 0  . escitalopram (LEXAPRO) 10 MG tablet Take 10 mg by mouth daily.    Marland Kitchen esomeprazole (NEXIUM) 40 MG capsule Take 40 mg by mouth every morning.     . gabapentin (NEURONTIN) 300 MG capsule Take 1 capsule by mouth at bedtime.    Marland Kitchen glipiZIDE (GLUCOTROL) 5 MG tablet Take 5 mg by mouth 2 (two) times daily.    Marland Kitchen glucose blood (ACCU-CHEK GUIDE) test strip Use as instructed 150 each 2  . hydrochlorothiazide  (MICROZIDE) 12.5 MG capsule Take 12.5 mg by mouth daily.    Marland Kitchen HYDROcodone-acetaminophen (NORCO/VICODIN) 5-325 MG tablet Take 1 tablet by mouth every 6 (six) hours as needed. 20 tablet 0  . LINZESS 145 MCG CAPS capsule Take 145 mcg by mouth daily.    Marland Kitchen lisinopril (ZESTRIL) 20 MG tablet Take 20 mg by mouth daily.    . metFORMIN (GLUCOPHAGE) 1000 MG tablet Take 1,000 mg by mouth 2 (two) times daily with a meal.    . metoprolol succinate (TOPROL-XL) 25 MG 24 hr tablet Take 1 tablet by mouth daily.    . Omega-3 Fatty Acids (FISH OIL PO) Take 1 capsule by mouth 2 (two) times daily.    . tamsulosin (FLOMAX) 0.4 MG CAPS capsule Take 0.4 mg by mouth daily.      No current facility-administered medications for this visit.   Allergies:  Codeine and Morphine and related   Social History: The patient  reports that he has quit smoking. He has quit using smokeless tobacco.  His smokeless tobacco use included chew. He reports that he does not drink alcohol and does not use drugs.   Family History: The patient's family history includes Heart attack in his mother.   ROS:  Please see the history of present illness. Otherwise, complete review of systems is positive for none.  All other systems are reviewed and negative.   Physical Exam: VS:  BP 118/72   Pulse (!) 57   Ht 6' (1.829 m)   Wt 275 lb 9.6 oz (125 kg)   SpO2 97%   BMI 37.38 kg/m , BMI Body mass index is 37.38 kg/m.  Wt Readings from Last 3 Encounters:  10/17/20 275 lb 9.6 oz (125 kg)  07/02/19 270 lb (122.5 kg)  03/13/19 276 lb (125.2 kg)    General: Morbidly obese patient appears comfortable at rest. Neck: Supple, no elevated JVP or carotid bruits, no thyromegaly. Lungs: Clear to auscultation, nonlabored breathing at rest. Cardiac: Regular rate and rhythm, no S3 or significant systolic murmur, no pericardial rub. Extremities: No pitting edema, distal pulses 2+. Skin: Warm and dry. Musculoskeletal: No kyphosis. Neuropsychiatric: Alert  and oriented x3, affect grossly appropriate.  ECG:  An ECG dated 10/17/2020 was personally reviewed today and demonstrated:  Normal sinus rhythm rate of 60.  Recent Labwork: No results found for requested labs within last 8760 hours.  No results found for: CHOL, TRIG, HDL, CHOLHDL, VLDL, LDLCALC, LDLDIRECT  Other Studies Reviewed Today:  Coronary CT 09/08/2018 IMPRESSION: 1. Coronary calcium score 11.5 Agatston units. This places the patient in the 46th percentile for age and gender, suggesting intermediate risk for future cardiac events.  2.  Nonobstructive mild CAD.  [place] [employer]  Assessment and Plan:  1. Dyspnea, unspecified type   2. Chronic obstructive pulmonary disease, unspecified COPD type ([place])   3. Chest pain, unspecified type    1. Dyspnea, unspecified type Has chronic dyspnea from COPD.  Has sleep apnea and likely obesity hypoventilation syndrome.   Last echocardiogram 2020 demonstrated EF 60 to 65% no wall motion abnormalities with grade 1 diastolic dysfunction..  Please get a repeat echocardiogram to evaluate LV function, diastolic function and valvular function.  2. Chronic obstructive pulmonary disease, unspecified COPD type ([place]) Long history of smoking.  Currently does not smoke but was treated in the past by Dr. Luan Williams.  Has chronic dyspnea is resolved.  3. Chest pain, unspecified type His fleeting chest pain not related to activity.  Denies any radiation or associated symptoms.  Coronary CT 2020 showed nonobstructive mild CAD.  Continue aspirin 81 mg.  Continue atorvastatin 40 mg daily.  EKG today shows normal sinus rhythm with rate of 60.  No acute ST or T wave abnormalities.  4.  Hypertension Blood pressure well controlled on current therapy.  Continue HCTZ 12.5 mg, lisinopril 20 mg daily.  Continue Toprol-XL 25 mg daily.  Medication Adjustments/Labs and Tests Ordered: Current medicines are reviewed at length with the patient today.  Concerns regarding  medicines are outlined above.   Disposition: Follow-up with Dr. Harl Williams or APP 6 months  Signed, Levell July, NP 10/17/2020 8:27 AM    [facility] [place], [place], [place] [address] Phone: [phone]; Fax: [phone]

## 2020-10-17 ENCOUNTER — Other Ambulatory Visit: Payer: Self-pay

## 2020-10-17 ENCOUNTER — Encounter: Payer: Self-pay | Admitting: Family Medicine

## 2020-10-17 ENCOUNTER — Ambulatory Visit: Payer: PPO | Admitting: Family Medicine

## 2020-10-17 VITALS — BP 118/72 | HR 57 | Ht 72.0 in | Wt 275.6 lb

## 2020-10-17 DIAGNOSIS — R0600 Dyspnea, unspecified: Secondary | ICD-10-CM

## 2020-10-17 DIAGNOSIS — R079 Chest pain, unspecified: Secondary | ICD-10-CM | POA: Diagnosis not present

## 2020-10-17 DIAGNOSIS — I10 Essential (primary) hypertension: Secondary | ICD-10-CM | POA: Diagnosis not present

## 2020-10-17 DIAGNOSIS — E559 Vitamin D deficiency, unspecified: Secondary | ICD-10-CM | POA: Diagnosis not present

## 2020-10-17 DIAGNOSIS — J449 Chronic obstructive pulmonary disease, unspecified: Secondary | ICD-10-CM

## 2020-10-17 DIAGNOSIS — Z79899 Other long term (current) drug therapy: Secondary | ICD-10-CM | POA: Diagnosis not present

## 2020-10-17 DIAGNOSIS — D649 Anemia, unspecified: Secondary | ICD-10-CM | POA: Diagnosis not present

## 2020-10-17 DIAGNOSIS — R0602 Shortness of breath: Secondary | ICD-10-CM | POA: Diagnosis not present

## 2020-10-17 DIAGNOSIS — R0609 Other forms of dyspnea: Secondary | ICD-10-CM

## 2020-10-17 NOTE — Patient Instructions (Signed)

## 2020-10-21 DIAGNOSIS — G4733 Obstructive sleep apnea (adult) (pediatric): Secondary | ICD-10-CM | POA: Diagnosis not present

## 2020-10-27 DIAGNOSIS — Z01818 Encounter for other preprocedural examination: Secondary | ICD-10-CM | POA: Diagnosis not present

## 2020-10-30 DIAGNOSIS — I10 Essential (primary) hypertension: Secondary | ICD-10-CM | POA: Diagnosis not present

## 2020-10-30 DIAGNOSIS — Z87891 Personal history of nicotine dependence: Secondary | ICD-10-CM | POA: Diagnosis not present

## 2020-10-30 DIAGNOSIS — E785 Hyperlipidemia, unspecified: Secondary | ICD-10-CM | POA: Diagnosis not present

## 2020-10-30 DIAGNOSIS — Z1211 Encounter for screening for malignant neoplasm of colon: Secondary | ICD-10-CM | POA: Diagnosis not present

## 2020-10-30 DIAGNOSIS — K219 Gastro-esophageal reflux disease without esophagitis: Secondary | ICD-10-CM | POA: Diagnosis not present

## 2020-10-30 DIAGNOSIS — Q438 Other specified congenital malformations of intestine: Secondary | ICD-10-CM | POA: Diagnosis not present

## 2020-10-30 DIAGNOSIS — Z885 Allergy status to narcotic agent status: Secondary | ICD-10-CM | POA: Diagnosis not present

## 2020-10-30 DIAGNOSIS — E669 Obesity, unspecified: Secondary | ICD-10-CM | POA: Diagnosis not present

## 2020-10-30 DIAGNOSIS — Z7982 Long term (current) use of aspirin: Secondary | ICD-10-CM | POA: Diagnosis not present

## 2020-10-30 DIAGNOSIS — E119 Type 2 diabetes mellitus without complications: Secondary | ICD-10-CM | POA: Diagnosis not present

## 2020-10-30 DIAGNOSIS — Z79899 Other long term (current) drug therapy: Secondary | ICD-10-CM | POA: Diagnosis not present

## 2020-10-30 DIAGNOSIS — Z880 Allergy status to penicillin: Secondary | ICD-10-CM | POA: Diagnosis not present

## 2020-10-30 DIAGNOSIS — Z7984 Long term (current) use of oral hypoglycemic drugs: Secondary | ICD-10-CM | POA: Diagnosis not present

## 2020-11-19 ENCOUNTER — Ambulatory Visit (INDEPENDENT_AMBULATORY_CARE_PROVIDER_SITE_OTHER): Payer: PPO

## 2020-11-19 DIAGNOSIS — R0602 Shortness of breath: Secondary | ICD-10-CM

## 2020-11-19 DIAGNOSIS — R0600 Dyspnea, unspecified: Secondary | ICD-10-CM

## 2020-11-19 DIAGNOSIS — R0609 Other forms of dyspnea: Secondary | ICD-10-CM

## 2020-11-19 LAB — ECHOCARDIOGRAM COMPLETE
AR max vel: 1.92 cm2
AV Area VTI: 2.1 cm2
AV Area mean vel: 2.04 cm2
AV Mean grad: 2.6 mmHg
AV Peak grad: 5.8 mmHg
Ao pk vel: 1.21 m/s
Area-P 1/2: 3.42 cm2
Calc EF: 62.2 %
MV M vel: 3.39 m/s
MV Peak grad: 46 mmHg
S' Lateral: 2.63 cm
Single Plane A2C EF: 61.3 %
Single Plane A4C EF: 62.3 %

## 2020-11-21 ENCOUNTER — Telehealth: Payer: Self-pay | Admitting: *Deleted

## 2020-11-21 DIAGNOSIS — G4733 Obstructive sleep apnea (adult) (pediatric): Secondary | ICD-10-CM | POA: Diagnosis not present

## 2020-11-21 NOTE — Telephone Encounter (Signed)
-----   Message from Netta Neat., NP sent at 11/20/2020  7:50 AM EDT ----- Please call the patient and let him know the echocardiogram showed the pumping function of his heart looks good.  He has no leaky valves.  Everything looks good on the echocardiogram.

## 2020-11-21 NOTE — Telephone Encounter (Signed)
Lesle Chris, LPN  5/97/4163 8:45 PM EDT Back to Top     Notified, copy to pcp.

## 2020-12-04 DIAGNOSIS — M25562 Pain in left knee: Secondary | ICD-10-CM | POA: Diagnosis not present

## 2020-12-04 DIAGNOSIS — K219 Gastro-esophageal reflux disease without esophagitis: Secondary | ICD-10-CM | POA: Diagnosis not present

## 2020-12-04 DIAGNOSIS — Z6838 Body mass index (BMI) 38.0-38.9, adult: Secondary | ICD-10-CM | POA: Diagnosis not present

## 2020-12-04 DIAGNOSIS — I10 Essential (primary) hypertension: Secondary | ICD-10-CM | POA: Diagnosis not present

## 2020-12-04 DIAGNOSIS — E7801 Familial hypercholesterolemia: Secondary | ICD-10-CM | POA: Diagnosis not present

## 2020-12-04 DIAGNOSIS — F339 Major depressive disorder, recurrent, unspecified: Secondary | ICD-10-CM | POA: Diagnosis not present

## 2020-12-04 DIAGNOSIS — M25561 Pain in right knee: Secondary | ICD-10-CM | POA: Diagnosis not present

## 2020-12-04 DIAGNOSIS — E1169 Type 2 diabetes mellitus with other specified complication: Secondary | ICD-10-CM | POA: Diagnosis not present

## 2020-12-09 DIAGNOSIS — G4733 Obstructive sleep apnea (adult) (pediatric): Secondary | ICD-10-CM | POA: Diagnosis not present

## 2020-12-10 DIAGNOSIS — I2584 Coronary atherosclerosis due to calcified coronary lesion: Secondary | ICD-10-CM | POA: Diagnosis not present

## 2020-12-10 DIAGNOSIS — I2510 Atherosclerotic heart disease of native coronary artery without angina pectoris: Secondary | ICD-10-CM | POA: Diagnosis not present

## 2020-12-10 DIAGNOSIS — I700 Atherosclerosis of aorta: Secondary | ICD-10-CM | POA: Diagnosis not present

## 2020-12-10 DIAGNOSIS — Z87891 Personal history of nicotine dependence: Secondary | ICD-10-CM | POA: Diagnosis not present

## 2020-12-10 DIAGNOSIS — J439 Emphysema, unspecified: Secondary | ICD-10-CM | POA: Diagnosis not present

## 2020-12-15 ENCOUNTER — Encounter: Payer: Self-pay | Admitting: Emergency Medicine

## 2020-12-21 DIAGNOSIS — G4733 Obstructive sleep apnea (adult) (pediatric): Secondary | ICD-10-CM | POA: Diagnosis not present

## 2020-12-24 ENCOUNTER — Emergency Department (HOSPITAL_COMMUNITY)
Admission: EM | Admit: 2020-12-24 | Discharge: 2020-12-24 | Disposition: A | Discharge status: Home / Self Care | Payer: PPO | Attending: Emergency Medicine | Admitting: Emergency Medicine

## 2020-12-24 ENCOUNTER — Other Ambulatory Visit: Payer: Self-pay

## 2020-12-24 ENCOUNTER — Encounter (HOSPITAL_COMMUNITY): Payer: Self-pay

## 2020-12-24 DIAGNOSIS — Z5321 Procedure and treatment not carried out due to patient leaving prior to being seen by health care provider: Secondary | ICD-10-CM | POA: Insufficient documentation

## 2020-12-24 DIAGNOSIS — Z880 Allergy status to penicillin: Secondary | ICD-10-CM | POA: Diagnosis not present

## 2020-12-24 DIAGNOSIS — N200 Calculus of kidney: Secondary | ICD-10-CM | POA: Diagnosis not present

## 2020-12-24 DIAGNOSIS — N3001 Acute cystitis with hematuria: Secondary | ICD-10-CM | POA: Diagnosis not present

## 2020-12-24 DIAGNOSIS — S22080A Wedge compression fracture of T11-T12 vertebra, initial encounter for closed fracture: Secondary | ICD-10-CM | POA: Diagnosis not present

## 2020-12-24 DIAGNOSIS — R319 Hematuria, unspecified: Secondary | ICD-10-CM | POA: Insufficient documentation

## 2020-12-24 DIAGNOSIS — S3991XA Unspecified injury of abdomen, initial encounter: Secondary | ICD-10-CM | POA: Diagnosis not present

## 2020-12-24 DIAGNOSIS — N4889 Other specified disorders of penis: Secondary | ICD-10-CM | POA: Diagnosis not present

## 2020-12-24 DIAGNOSIS — Z885 Allergy status to narcotic agent status: Secondary | ICD-10-CM | POA: Diagnosis not present

## 2020-12-24 NOTE — ED Triage Notes (Signed)
Pt to er, pt states that he is here because he is having pain and burning with urination, states that he is urinating blood, states that he has a hx of kidney stones.  Pt states that he is not obstructed and just urinated and now it is clear yellow.

## 2021-01-02 DIAGNOSIS — K219 Gastro-esophageal reflux disease without esophagitis: Secondary | ICD-10-CM | POA: Diagnosis not present

## 2021-01-02 DIAGNOSIS — Z6838 Body mass index (BMI) 38.0-38.9, adult: Secondary | ICD-10-CM | POA: Diagnosis not present

## 2021-01-02 DIAGNOSIS — I10 Essential (primary) hypertension: Secondary | ICD-10-CM | POA: Diagnosis not present

## 2021-01-02 DIAGNOSIS — E1169 Type 2 diabetes mellitus with other specified complication: Secondary | ICD-10-CM | POA: Diagnosis not present

## 2021-01-02 DIAGNOSIS — M25562 Pain in left knee: Secondary | ICD-10-CM | POA: Diagnosis not present

## 2021-01-02 DIAGNOSIS — I700 Atherosclerosis of aorta: Secondary | ICD-10-CM | POA: Diagnosis not present

## 2021-01-02 DIAGNOSIS — M25561 Pain in right knee: Secondary | ICD-10-CM | POA: Diagnosis not present

## 2021-01-02 DIAGNOSIS — F339 Major depressive disorder, recurrent, unspecified: Secondary | ICD-10-CM | POA: Diagnosis not present

## 2021-01-06 DIAGNOSIS — Z8679 Personal history of other diseases of the circulatory system: Secondary | ICD-10-CM | POA: Diagnosis not present

## 2021-01-06 DIAGNOSIS — M545 Low back pain, unspecified: Secondary | ICD-10-CM | POA: Diagnosis not present

## 2021-01-06 DIAGNOSIS — F419 Anxiety disorder, unspecified: Secondary | ICD-10-CM | POA: Diagnosis not present

## 2021-01-06 DIAGNOSIS — R569 Unspecified convulsions: Secondary | ICD-10-CM | POA: Diagnosis not present

## 2021-01-06 DIAGNOSIS — R42 Dizziness and giddiness: Secondary | ICD-10-CM | POA: Diagnosis not present

## 2021-01-06 DIAGNOSIS — G5600 Carpal tunnel syndrome, unspecified upper limb: Secondary | ICD-10-CM | POA: Diagnosis not present

## 2021-01-06 DIAGNOSIS — M25551 Pain in right hip: Secondary | ICD-10-CM | POA: Diagnosis not present

## 2021-01-06 DIAGNOSIS — R519 Headache, unspecified: Secondary | ICD-10-CM | POA: Diagnosis not present

## 2021-01-06 DIAGNOSIS — R269 Unspecified abnormalities of gait and mobility: Secondary | ICD-10-CM | POA: Diagnosis not present

## 2021-01-06 DIAGNOSIS — G4700 Insomnia, unspecified: Secondary | ICD-10-CM | POA: Diagnosis not present

## 2021-01-06 DIAGNOSIS — M25552 Pain in left hip: Secondary | ICD-10-CM | POA: Diagnosis not present

## 2021-01-06 DIAGNOSIS — G4733 Obstructive sleep apnea (adult) (pediatric): Secondary | ICD-10-CM | POA: Diagnosis not present

## 2021-01-06 DIAGNOSIS — E785 Hyperlipidemia, unspecified: Secondary | ICD-10-CM | POA: Diagnosis not present

## 2021-01-07 ENCOUNTER — Institutional Professional Consult (permissible substitution): Payer: PPO | Admitting: Emergency Medicine

## 2021-01-08 ENCOUNTER — Institutional Professional Consult (permissible substitution): Payer: PPO | Admitting: Internal Medicine

## 2021-01-19 DIAGNOSIS — G4733 Obstructive sleep apnea (adult) (pediatric): Secondary | ICD-10-CM | POA: Diagnosis not present

## 2021-01-21 DIAGNOSIS — G4733 Obstructive sleep apnea (adult) (pediatric): Secondary | ICD-10-CM | POA: Diagnosis not present

## 2021-02-09 ENCOUNTER — Encounter: Payer: Self-pay | Admitting: Pulmonary Disease

## 2021-02-09 ENCOUNTER — Ambulatory Visit: Payer: PPO | Admitting: Pulmonary Disease

## 2021-02-09 ENCOUNTER — Other Ambulatory Visit: Payer: Self-pay

## 2021-02-09 VITALS — BP 142/80 | HR 66 | Ht 72.0 in | Wt 280.6 lb

## 2021-02-09 DIAGNOSIS — R0600 Dyspnea, unspecified: Secondary | ICD-10-CM | POA: Diagnosis not present

## 2021-02-09 DIAGNOSIS — R0609 Other forms of dyspnea: Secondary | ICD-10-CM

## 2021-02-09 DIAGNOSIS — J438 Other emphysema: Secondary | ICD-10-CM | POA: Diagnosis not present

## 2021-02-09 MED ORDER — TRELEGY ELLIPTA 100-62.5-25 MCG/INH IN AEPB: 1.0000 | INHALATION_SPRAY | Freq: Every day | RESPIRATORY_TRACT | 11 refills | Status: DC

## 2021-02-09 NOTE — Progress Notes (Signed)
'@Patient'  ID: Xavier Williams, male    DOB: 07/07/1961, 60 y.o.   MRN: [id number]  Chief Complaint  Patient presents with   Consult    Referred by PCP for DOE. Denies being on O2 at home but does sleep with a cpap machine. Denies any productive couighing.     Referring provider: Ewa Gentry Nation, MD  HPI:   60 year old whom we are seeing in consultation for evaluation of dyspnea on exertion.  PCP note reviewed.  Most recent cardiology note 09/2020 reviewed.  Patient reports ongoing dyspnea on exertion for about 3 years.  Present every day.  Slightly worsened over time.  Worse with inclines or stairs.  No time of day where things are better or worse.  No seasonal environmental changes that he can identify that make things better or worse.  Has been prescribed multiple inhalers.  Sounds like he does not use them consistently for periods of time.  Most recently, tried Stiolto for a few days did not think it helped much.  No other alleviating or exacerbating factors that he can identify.  Partner who is in room states that things worse since COVID.  He agrees with this.  This was diagnosed 06/2019.  Reviewed serial chest images.  Chest x-ray 06/2019 with hazy left peripheral infiltrates that is new compared to prior most recent chest x-ray 05/2018 on my interpretation.  CT coronary morphology 08/2018 reviewed below my interpretation revealed hazy left peripheral opacity favored to represent motion artifact, no evidence of frank fibrosis, mild emphysema.  TTE 10/2020 reviewed with no significant abnormality.  PMH: Tobacco abuse, emphysema, diabetes, hyperlipidemia, hypertension Surgical history: No significant surgeries per his report, history of cardiac catheterization, colonoscopy in the past Family history: CAD in mother Social history: Former smoker, lives in [place], former patient of Dr. Luan Pulling   Questionaires / Pulmonary Flowsheets:   ACT:  No flowsheet data found.  MMRC: No flowsheet data  found.  [place]:  No flowsheet data found.  Tests:   FENO:  No results found for: NITRICOXIDE  PFT: PFT Results Latest Ref Rng & Units 06/28/2018  FVC-Pre L 4.71  FVC-Predicted Pre % 91  FVC-Post L 4.82  FVC-Predicted Post % 93  Pre FEV1/FVC % % 74  Post FEV1/FCV % % 75  FEV1-Pre L 3.48  FEV1-Predicted Pre % 88  FEV1-Post L 3.64  DLCO uncorrected ml/min/mmHg 32.68  DLCO UNC% % 93  DLVA Predicted % 98  TLC L 8.61  TLC % Predicted % 116  RV % Predicted % 160    WALK:  No flowsheet data found.  Imaging: No results found.  Lab Results:  CBC    Component Value Date/Time   WBC 8.5 08/01/2018 1123   RBC 5.61 08/01/2018 1123   HGB 12.2 (L) 08/01/2018 1123   HCT 42.2 08/01/2018 1123   PLT 418 (H) 08/01/2018 1123   MCV 75.2 (L) 08/01/2018 1123   MCH 21.7 (L) 08/01/2018 1123   MCHC 28.9 (L) 08/01/2018 1123   RDW 18.0 (H) 08/01/2018 1123   LYMPHSABS 3.2 10/24/2015 2055   MONOABS 1.1 (H) 10/24/2015 2055   EOSABS 0.1 10/24/2015 2055   BASOSABS 0.0 10/24/2015 2055    BMET    Component Value Date/Time   NA 138 08/22/2018 1052   K 4.0 08/22/2018 1052   CL 104 08/22/2018 1052   CO2 25 08/22/2018 1052   GLUCOSE 204 (H) 08/22/2018 1052   BUN 19 08/22/2018 1052   CREATININE 1.08 08/22/2018 1052  CALCIUM 9.3 08/22/2018 1052   GFRNONAA >60 08/22/2018 1052   GFRAA >60 08/22/2018 1052    BNP No results found for: BNP  ProBNP No results found for: PROBNP  Specialty Problems   None   Allergies  Allergen Reactions   Codeine    Morphine And Related Nausea And Vomiting    Immunization History  Administered Date(s) Administered   Tdap 03/11/2013    Past Medical History:  Diagnosis Date   Chronic back pain    DDD (degenerative disc disease), lumbar    Depression    Diabetes mellitus    Hyperlipidemia    Hypertension    Kidney stones    Schizophrenia ([place])    Sleep apnea     Tobacco History: Social History   Tobacco Use  Smoking Status Former    Packs/day: 1.00   Types: Cigarettes   Start date: 07/26/1973  Smokeless Tobacco Former   Types: Chew   Counseling given: Not Answered   Continue to not smoke  Outpatient Encounter Medications as of 02/09/2021  Medication Sig   albuterol (PROVENTIL HFA;VENTOLIN HFA) 108 (90 Base) MCG/ACT inhaler Inhale 2 puffs into the lungs every 6 (six) hours as needed for wheezing or shortness of breath.   allopurinol (ZYLOPRIM) 100 MG tablet Take 100 mg by mouth daily.    APTIOM 400 MG TABS Take 1 tablet by mouth daily.   aspirin EC 81 MG tablet Take 81 mg by mouth every morning.   aspirin-acetaminophen-caffeine (EXCEDRIN MIGRAINE) 250-250-65 MG tablet Take 2 tablets by mouth every 6 (six) hours as needed for headache.   atorvastatin (LIPITOR) 40 MG tablet Take 40 mg by mouth daily.   Blood Glucose Monitoring Suppl (ACCU-CHEK GUIDE ME) w/Device KIT 1 Piece by Does not apply route as directed.   escitalopram (LEXAPRO) 10 MG tablet Take 10 mg by mouth daily.   esomeprazole (NEXIUM) 40 MG capsule Take 40 mg by mouth every morning.    Fluticasone-Umeclidin-Vilant (TRELEGY ELLIPTA) 100-62.5-25 MCG/INH AEPB Inhale 1 puff into the lungs daily.   gabapentin (NEURONTIN) 300 MG capsule Take 1 capsule by mouth at bedtime.   glipiZIDE (GLUCOTROL) 5 MG tablet Take 5 mg by mouth 2 (two) times daily.   glucose blood (ACCU-CHEK GUIDE) test strip Use as instructed   hydrochlorothiazide (MICROZIDE) 12.5 MG capsule Take 12.5 mg by mouth daily.   HYDROcodone-acetaminophen (NORCO/VICODIN) 5-325 MG tablet Take 1 tablet by mouth every 6 (six) hours as needed.   LINZESS 145 MCG CAPS capsule Take 145 mcg by mouth daily.   lisinopril (ZESTRIL) 20 MG tablet Take 20 mg by mouth daily.   metFORMIN (GLUCOPHAGE) 1000 MG tablet Take 1,000 mg by mouth 2 (two) times daily with a meal.   metoprolol succinate (TOPROL-XL) 25 MG 24 hr tablet Take 1 tablet by mouth daily.   Omega-3 Fatty Acids (FISH OIL PO) Take 1 capsule by mouth 2  (two) times daily.   tamsulosin (FLOMAX) 0.4 MG CAPS capsule Take 0.4 mg by mouth daily.    [DISCONTINUED] STIOLTO RESPIMAT 2.5-2.5 MCG/ACT AERS    [DISCONTINUED] ANORO ELLIPTA 62.5-25 MCG/INH AEPB Inhale 1 puff into the lungs daily.   No facility-administered encounter medications on file as of 02/09/2021.     Review of Systems  Review of Systems  No chest pain with exertion.  No orthopnea or PND.  No worsening lower extremity swelling.  Comprehensive review of systems otherwise negative.  Physical Exam  BP (!) 142/80   Pulse 66   Ht 6' (  1.829 m)   Wt 280 lb 9.6 oz (127.3 kg)   SpO2 98% Comment: on RA  BMI 38.06 kg/m   Wt Readings from Last 5 Encounters:  02/09/21 280 lb 9.6 oz (127.3 kg)  10/17/20 275 lb 9.6 oz (125 kg)  07/02/19 270 lb (122.5 kg)  03/13/19 276 lb (125.2 kg)  12/26/18 289 lb 12.8 oz (131.5 kg)    BMI Readings from Last 5 Encounters:  02/09/21 38.06 kg/m  10/17/20 37.38 kg/m  07/02/19 36.62 kg/m  03/13/19 37.43 kg/m  12/26/18 39.30 kg/m     Physical Exam General: Sitting in chair, no acute distress Eyes: EOMI, icterus Neck: Supple, no JVP Cardiovascular: Regular rate and rhythm, no murmur Pulmonary: Distant, clear to auscultation, no wheeze, normal work of breathing Abdomen: Nondistended, bowel sounds present MSK: No synovitis, joint effusion Neuro: Ambulates with cane, no weakness Psych: Normal mood, flat affect   Assessment & Plan:   DOE: Likely multifactorial primarily driven by emphysema and gas trapping on prior PFTs as well as element of deconditioning. Recent CT chest report without evidence of fibrosis.  Recent echocardiogram 10/2020 without significant abnormality.  Emphysema: related to h/o cigarette smoking. Gas trapping on prior PFTs, no fixed obstruction in 2019. To use Stiolto as prescribed daily for 4 weeks. If symptoms not improved, sent script for trelegy to maximize inhaler therapy.   Return in about 3 months (around  05/12/2021).   Lanier Clam, MD 02/10/2021

## 2021-02-09 NOTE — Patient Instructions (Addendum)
Nice to meet you  Continue Stiolto for the next 4 weeks.  If breathing is not better, use Trelegy 1 puff daily.  This adds a steroid to the inhaler to hopefully help with your shortness of breath.  I think a lot of your shortness of breath is likely related to emphysema.  Please let me know in a few weeks after starting Trelegy if the inhaler is helped or not.  Fortunately, your recent CT scan does not show any evidence of scarring on the lead, I request the images and review personally.   Return to clinic in 3 months or sooner as needed for follow-up with Dr. Judeth Horn

## 2021-02-20 DIAGNOSIS — G4733 Obstructive sleep apnea (adult) (pediatric): Secondary | ICD-10-CM | POA: Diagnosis not present

## 2021-02-24 DIAGNOSIS — J449 Chronic obstructive pulmonary disease, unspecified: Secondary | ICD-10-CM | POA: Diagnosis not present

## 2021-02-24 DIAGNOSIS — I700 Atherosclerosis of aorta: Secondary | ICD-10-CM | POA: Diagnosis not present

## 2021-02-24 DIAGNOSIS — E1169 Type 2 diabetes mellitus with other specified complication: Secondary | ICD-10-CM | POA: Diagnosis not present

## 2021-02-24 DIAGNOSIS — F339 Major depressive disorder, recurrent, unspecified: Secondary | ICD-10-CM | POA: Diagnosis not present

## 2021-02-24 DIAGNOSIS — M25562 Pain in left knee: Secondary | ICD-10-CM | POA: Diagnosis not present

## 2021-02-24 DIAGNOSIS — K219 Gastro-esophageal reflux disease without esophagitis: Secondary | ICD-10-CM | POA: Diagnosis not present

## 2021-02-24 DIAGNOSIS — I10 Essential (primary) hypertension: Secondary | ICD-10-CM | POA: Diagnosis not present

## 2021-02-24 DIAGNOSIS — M25561 Pain in right knee: Secondary | ICD-10-CM | POA: Diagnosis not present

## 2021-03-21 DIAGNOSIS — G4733 Obstructive sleep apnea (adult) (pediatric): Secondary | ICD-10-CM | POA: Diagnosis not present

## 2021-03-23 DIAGNOSIS — G4733 Obstructive sleep apnea (adult) (pediatric): Secondary | ICD-10-CM | POA: Diagnosis not present

## 2021-04-02 DIAGNOSIS — E785 Hyperlipidemia, unspecified: Secondary | ICD-10-CM | POA: Diagnosis not present

## 2021-04-02 DIAGNOSIS — R42 Dizziness and giddiness: Secondary | ICD-10-CM | POA: Diagnosis not present

## 2021-04-02 DIAGNOSIS — M25551 Pain in right hip: Secondary | ICD-10-CM | POA: Diagnosis not present

## 2021-04-02 DIAGNOSIS — G4700 Insomnia, unspecified: Secondary | ICD-10-CM | POA: Diagnosis not present

## 2021-04-02 DIAGNOSIS — Z8679 Personal history of other diseases of the circulatory system: Secondary | ICD-10-CM | POA: Diagnosis not present

## 2021-04-02 DIAGNOSIS — M25552 Pain in left hip: Secondary | ICD-10-CM | POA: Diagnosis not present

## 2021-04-02 DIAGNOSIS — M545 Low back pain, unspecified: Secondary | ICD-10-CM | POA: Diagnosis not present

## 2021-04-02 DIAGNOSIS — F419 Anxiety disorder, unspecified: Secondary | ICD-10-CM | POA: Diagnosis not present

## 2021-04-02 DIAGNOSIS — R269 Unspecified abnormalities of gait and mobility: Secondary | ICD-10-CM | POA: Diagnosis not present

## 2021-04-02 DIAGNOSIS — G4733 Obstructive sleep apnea (adult) (pediatric): Secondary | ICD-10-CM | POA: Diagnosis not present

## 2021-04-02 DIAGNOSIS — R519 Headache, unspecified: Secondary | ICD-10-CM | POA: Diagnosis not present

## 2021-04-02 DIAGNOSIS — G5600 Carpal tunnel syndrome, unspecified upper limb: Secondary | ICD-10-CM | POA: Diagnosis not present

## 2021-04-23 DIAGNOSIS — G4733 Obstructive sleep apnea (adult) (pediatric): Secondary | ICD-10-CM | POA: Diagnosis not present

## 2021-04-24 ENCOUNTER — Ambulatory Visit: Payer: PPO | Admitting: Cardiology

## 2021-04-24 DIAGNOSIS — G4733 Obstructive sleep apnea (adult) (pediatric): Secondary | ICD-10-CM | POA: Diagnosis not present

## 2021-04-24 NOTE — Progress Notes (Deleted)
Clinical Summary Xavier Williams is a 60 y.o.male   1. SOB - from pcp notes PFTs were ok. Symptoms thought out of proportion to his lung disease according to pulmonary Jan 2020 echo LVEF 60-65%, no WMAs, grade I diastolic dysfunction Jan 9458 LLE [place] no DVT     - SOB for a few years, gradually worsening - DOE with activities, DOE just walking in from parking lot -mild cough, + wheezing. No LE edema. Chronically sleeps at angle    08/2020 coronary CTA: mild CAD 10/2020 echo: LVEF 60-65%, no WMAs, normal diastoli function, normal RV   2. Chest pain - sharp pain midchest, can occur at rest. Can get diaphoretic, nauseous. Lasts a few seconds, about 1-2 episodes per month - remote cath several years. Remote stress tests.    CAD risk factors; DM2, HTN, HL, former tobacco x 30 years.      3. COPD - followed by Dr Luan Pulling.   Past Medical History:  Diagnosis Date   Chronic back pain    DDD (degenerative disc disease), lumbar    Depression    Diabetes mellitus    Hyperlipidemia    Hypertension    Kidney stones    Schizophrenia ([facility])    Sleep apnea      Allergies  Allergen Reactions   Codeine    Morphine And Related Nausea And Vomiting     Current Outpatient Medications  Medication Sig Dispense Refill   albuterol (PROVENTIL HFA;VENTOLIN HFA) 108 (90 Base) MCG/ACT inhaler Inhale 2 puffs into the lungs every 6 (six) hours as needed for wheezing or shortness of breath.     allopurinol (ZYLOPRIM) 100 MG tablet Take 100 mg by mouth daily.      APTIOM 400 MG TABS Take 1 tablet by mouth daily.     aspirin EC 81 MG tablet Take 81 mg by mouth every morning.     aspirin-acetaminophen-caffeine (EXCEDRIN MIGRAINE) 250-250-65 MG tablet Take 2 tablets by mouth every 6 (six) hours as needed for headache.     atorvastatin (LIPITOR) 40 MG tablet Take 40 mg by mouth daily.     Blood Glucose Monitoring Suppl (ACCU-CHEK GUIDE ME) w/Device KIT 1 Piece by Does not apply route as directed. 1 kit  0   escitalopram (LEXAPRO) 10 MG tablet Take 10 mg by mouth daily.     esomeprazole (NEXIUM) 40 MG capsule Take 40 mg by mouth every morning.      Fluticasone-Umeclidin-Vilant (TRELEGY ELLIPTA) 100-62.5-25 MCG/INH AEPB Inhale 1 puff into the lungs daily. 60 each 11   gabapentin (NEURONTIN) 300 MG capsule Take 1 capsule by mouth at bedtime.     glipiZIDE (GLUCOTROL) 5 MG tablet Take 5 mg by mouth 2 (two) times daily.     glucose blood (ACCU-CHEK GUIDE) test strip Use as instructed 150 each 2   hydrochlorothiazide (MICROZIDE) 12.5 MG capsule Take 12.5 mg by mouth daily.     HYDROcodone-acetaminophen (NORCO/VICODIN) 5-325 MG tablet Take 1 tablet by mouth every 6 (six) hours as needed. 20 tablet 0   LINZESS 145 MCG CAPS capsule Take 145 mcg by mouth daily.     lisinopril (ZESTRIL) 20 MG tablet Take 20 mg by mouth daily.     metFORMIN (GLUCOPHAGE) 1000 MG tablet Take 1,000 mg by mouth 2 (two) times daily with a meal.     metoprolol succinate (TOPROL-XL) 25 MG 24 hr tablet Take 1 tablet by mouth daily.     Omega-3 Fatty Acids (FISH OIL PO)  Take 1 capsule by mouth 2 (two) times daily.     tamsulosin (FLOMAX) 0.4 MG CAPS capsule Take 0.4 mg by mouth daily.      No current facility-administered medications for this visit.     Past Surgical History:  Procedure Laterality Date   CARDIAC CATHETERIZATION  2005   normal coronary arteries   COLONOSCOPY       Allergies  Allergen Reactions   Codeine    Morphine And Related Nausea And Vomiting      Family History  Problem Relation Age of Onset   Heart attack Mother      Social History Xavier Williams reports that he has quit smoking. His smoking use included cigarettes. He started smoking about 47 years ago. He smoked an average of 1 pack per day. He has quit using smokeless tobacco.  His smokeless tobacco use included chew. Xavier Williams reports no history of alcohol use.   Review of Systems CONSTITUTIONAL: No weight loss, fever, chills, weakness  or fatigue.  HEENT: Eyes: No visual loss, blurred vision, double vision or yellow sclerae.No hearing loss, sneezing, congestion, runny nose or sore throat.  SKIN: No rash or itching.  CARDIOVASCULAR:  RESPIRATORY: No shortness of breath, cough or sputum.  GASTROINTESTINAL: No anorexia, nausea, vomiting or diarrhea. No abdominal pain or blood.  GENITOURINARY: No burning on urination, no polyuria NEUROLOGICAL: No headache, dizziness, syncope, paralysis, ataxia, numbness or tingling in the extremities. No change in bowel or bladder control.  MUSCULOSKELETAL: No muscle, back pain, joint pain or stiffness.  LYMPHATICS: No enlarged nodes. No history of splenectomy.  PSYCHIATRIC: No history of depression or anxiety.  ENDOCRINOLOGIC: No reports of sweating, cold or heat intolerance. No polyuria or polydipsia.  Marland Kitchen   Physical Examination There were no vitals filed for this visit. There were no vitals filed for this visit.  Gen: resting comfortably, no acute distress HEENT: no scleral icterus, pupils equal round and reactive, no palptable cervical adenopathy,  CV Resp: Clear to auscultation bilaterally GI: abdomen is soft, non-tender, non-distended, normal bowel sounds, no hepatosplenomegaly MSK: extremities are warm, no edema.  Skin: warm, no rash Neuro:  no focal deficits Psych: appropriate affect   Diagnostic Studies  08/2018 coronary CTA IMPRESSION: 1. Coronary calcium score 11.5 Agatston units. This places the patient in the 46th percentile for age and gender, suggesting intermediate risk for future cardiac events.   2.  Nonobstructive mild CAD   Assessment and Plan  1. SOB/Chest pain - from pulmonary evaluation symptoms out of proportion to his lung disease - echo with mild diastolic dysfunction. Exam limited by body habitus, no clear fluid overload - will try changing his HCTZ to lasix 55m daily for more potent diuretic, check BMET in 2 weeks - given multiple CAD risk factors  along with chest pain DOE plan for coronary CTA, based on body habitus I think nuclear imaging and echo would be limited      JArnoldo Lenis M.D., F.A.C.C.

## 2021-05-01 DIAGNOSIS — N390 Urinary tract infection, site not specified: Secondary | ICD-10-CM | POA: Diagnosis not present

## 2021-05-01 DIAGNOSIS — R300 Dysuria: Secondary | ICD-10-CM | POA: Diagnosis not present

## 2021-05-01 DIAGNOSIS — Z6841 Body Mass Index (BMI) 40.0 and over, adult: Secondary | ICD-10-CM | POA: Diagnosis not present

## 2021-05-14 DIAGNOSIS — H524 Presbyopia: Secondary | ICD-10-CM | POA: Diagnosis not present

## 2021-05-14 DIAGNOSIS — H35033 Hypertensive retinopathy, bilateral: Secondary | ICD-10-CM | POA: Diagnosis not present

## 2021-05-19 ENCOUNTER — Other Ambulatory Visit: Payer: Self-pay

## 2021-05-19 ENCOUNTER — Encounter: Payer: Self-pay | Admitting: Urology

## 2021-05-19 ENCOUNTER — Ambulatory Visit: Payer: PPO | Admitting: Urology

## 2021-05-19 VITALS — BP 107/66 | HR 68

## 2021-05-19 DIAGNOSIS — N200 Calculus of kidney: Secondary | ICD-10-CM | POA: Diagnosis not present

## 2021-05-19 DIAGNOSIS — Z87440 Personal history of urinary (tract) infections: Secondary | ICD-10-CM | POA: Diagnosis not present

## 2021-05-19 DIAGNOSIS — R310 Gross hematuria: Secondary | ICD-10-CM

## 2021-05-19 LAB — BLADDER SCAN AMB NON-IMAGING: Scan Result: 11

## 2021-05-19 NOTE — Progress Notes (Signed)
Assessment: 1. History of UTI   2. Gross hematuria   3. Nephrolithiasis      Plan: Today I had a discussion with the patient regarding the findings of gross hematuria including the implications and differential diagnoses associated with it.  I also discussed recommendations for further evaluation including the rationale for upper tract imaging and cystoscopy.  I discussed the nature of these procedures including potential risk and complications.  The patient expressed an understanding of these issues. BMP today Schedule for CT hematuria protocol and cystoscopy Continue tamsulosin   Chief Complaint:  Chief Complaint  Patient presents with   Urinary Tract Infection   Hematuria     History of Present Illness:  Xavier Williams is a 60 y.o. year old male who is seen in consultation from Quintin Alto, MD for evaluation of UTI and gross hematuria.  He was seen in the emergency room at [facility] on 12/24/2020 with a 24-hour history of gross hematuria and dysuria.  He was passing clots.  CT without contrast showed scattered small nonobstructing renal calculi with the largest in the left lower pole measuring 3 mm and a 3.9 cm hypodensity in the upper pole of the left kidney consistent with a simple cyst.  The bladder was unremarkable.  Urinalysis showed >25 WBCs, >25 RBCs, moderate bacteria, nitrite positive.  Urine culture grew >100 K E. coli.  He was treated with Cipro.  He presented to his PCP's office in [place] on 05/01/2021 with 2-day history of dysuria and dark urine.  He was also having some left-sided back and flank pain.  Urine culture grew E. coli.  He was treated with Bactrim x7 days. He does not currently have any UTI symptoms.  He does have baseline lower urinary tract symptoms of nocturia 2-3 times.  He has been on tamsulosin for several years.  He does have a prior history of ureteroscopic stone manipulation done in [place].   Past Medical History:  Past Medical History:   Diagnosis Date   Chronic back pain    DDD (degenerative disc disease), lumbar    Depression    Diabetes mellitus    Hyperlipidemia    Hypertension    Kidney stones    Schizophrenia (HCC)    Sleep apnea     Past Surgical History:  Past Surgical History:  Procedure Laterality Date   CARDIAC CATHETERIZATION  2005   normal coronary arteries   COLONOSCOPY      Allergies:  Allergies  Allergen Reactions   Codeine    Morphine And Related Nausea And Vomiting   Penicillins     Family History:  Family History  Problem Relation Age of Onset   Heart attack Mother     Social History:  Social History   Tobacco Use   Smoking status: Former    Packs/day: 1.00    Types: Cigarettes    Start date: 07/26/1973   Smokeless tobacco: Former    Types: Associate Professor Use: Never used  Substance Use Topics   Alcohol use: No   Drug use: No    Review of symptoms:  Constitutional:  Negative for unexplained weight loss, night sweats, fever, chills ENT:  Negative for nose bleeds, sinus pain, painful swallowing CV:  Negative for chest pain, shortness of breath, exercise intolerance, palpitations, loss of consciousness Resp:  Negative for cough, wheezing, shortness of breath GI:  Negative for nausea, vomiting, diarrhea, bloody stools GU:  Positives noted in HPI; otherwise  negative for urinary incontinence Neuro:  Negative for seizures, poor balance, limb weakness, slurred speech Psych:  Negative for lack of energy, depression, anxiety Endocrine:  Negative for polydipsia, polyuria, symptoms of hypoglycemia (dizziness, hunger, sweating) Hematologic:  Negative for anemia, purpura, petechia, prolonged or excessive bleeding, use of anticoagulants  Allergic:  Negative for difficulty breathing or choking as a result of exposure to anything; no shellfish allergy; no allergic response (rash/itch) to materials, foods  Physical exam: BP 107/66   Pulse 68  GENERAL APPEARANCE:  Well  appearing, well developed, well nourished, NAD HEENT: Atraumatic, Normocephalic, oropharynx clear. NECK: Supple without lymphadenopathy or thyromegaly. LUNGS: Clear to auscultation bilaterally. HEART: Regular Rate and Rhythm without murmurs, gallops, or rubs. ABDOMEN: Soft, non-tender, No Masses. EXTREMITIES: Moves all extremities well.  Without clubbing, cyanosis, or edema. NEUROLOGIC:  Alert and oriented x 3, normal gait, CN II-XII grossly intact.  MENTAL STATUS:  Appropriate. BACK:  Non-tender to palpation.  No CVAT SKIN:  Warm, dry and intact.   GU: Penis:  circumcised Meatus: Normal Scrotum: normal, no masses Testis: normal without masses bilateral Epididymis: normal Prostate: 40 g, NT, no nodules Rectum: Normal tone,  no masses or tenderness   Results: U/A dipstick: negative  PVR:  11 ml

## 2021-05-19 NOTE — Progress Notes (Signed)
Urological Symptom Review  Patient is experiencing the following symptoms: Frequent urination Get up at night to urinate Leakage of urine Urinary tract infection Erection problems (male only)   Review of Systems  Gastrointestinal (upper)  : Negative for upper GI symptoms  Gastrointestinal (lower) : Negative for lower GI symptoms  Constitutional : Night Sweats  Skin: Negative for skin symptoms  Eyes: Negative for eye symptoms  Ear/Nose/Throat : Sinus problems  Hematologic/Lymphatic: Negative for Hematologic/Lymphatic symptoms  Cardiovascular : Negative for cardiovascular symptoms  Respiratory : Shortness of breath  Endocrine: Negative for endocrine symptoms  Musculoskeletal: Back pain Joint pain  Neurological: Headaches Dizziness  Psychologic: Depression Anxiety

## 2021-05-20 LAB — URINALYSIS, ROUTINE W REFLEX MICROSCOPIC
Bilirubin, UA: NEGATIVE
Glucose, UA: NEGATIVE
Leukocytes,UA: NEGATIVE
Nitrite, UA: NEGATIVE
Protein,UA: NEGATIVE
RBC, UA: NEGATIVE
Specific Gravity, UA: 1.025 (ref 1.005–1.030)
Urobilinogen, Ur: 4 mg/dL — ABNORMAL HIGH (ref 0.2–1.0)
pH, UA: 5.5 (ref 5.0–7.5)

## 2021-05-20 LAB — BASIC METABOLIC PANEL
BUN/Creatinine Ratio: 20 (ref 10–24)
BUN: 23 mg/dL (ref 8–27)
CO2: 22 mmol/L (ref 20–29)
Calcium: 10.2 mg/dL (ref 8.6–10.2)
Chloride: 99 mmol/L (ref 96–106)
Creatinine, Ser: 1.16 mg/dL (ref 0.76–1.27)
Glucose: 126 mg/dL — ABNORMAL HIGH (ref 70–99)
Potassium: 4.7 mmol/L (ref 3.5–5.2)
Sodium: 139 mmol/L (ref 134–144)
eGFR: 72 mL/min/{1.73_m2} (ref >59)

## 2021-05-26 DIAGNOSIS — E7800 Pure hypercholesterolemia, unspecified: Secondary | ICD-10-CM | POA: Diagnosis not present

## 2021-05-26 DIAGNOSIS — K219 Gastro-esophageal reflux disease without esophagitis: Secondary | ICD-10-CM | POA: Diagnosis not present

## 2021-05-26 DIAGNOSIS — Z6841 Body Mass Index (BMI) 40.0 and over, adult: Secondary | ICD-10-CM | POA: Diagnosis not present

## 2021-05-26 DIAGNOSIS — E1169 Type 2 diabetes mellitus with other specified complication: Secondary | ICD-10-CM | POA: Diagnosis not present

## 2021-05-26 DIAGNOSIS — F339 Major depressive disorder, recurrent, unspecified: Secondary | ICD-10-CM | POA: Diagnosis not present

## 2021-05-26 DIAGNOSIS — J449 Chronic obstructive pulmonary disease, unspecified: Secondary | ICD-10-CM | POA: Diagnosis not present

## 2021-05-26 DIAGNOSIS — I10 Essential (primary) hypertension: Secondary | ICD-10-CM | POA: Diagnosis not present

## 2021-05-26 DIAGNOSIS — I700 Atherosclerosis of aorta: Secondary | ICD-10-CM | POA: Diagnosis not present

## 2021-05-26 DIAGNOSIS — Z23 Encounter for immunization: Secondary | ICD-10-CM | POA: Diagnosis not present

## 2021-05-26 DIAGNOSIS — M25562 Pain in left knee: Secondary | ICD-10-CM | POA: Diagnosis not present

## 2021-05-26 DIAGNOSIS — M25561 Pain in right knee: Secondary | ICD-10-CM | POA: Diagnosis not present

## 2021-05-26 DIAGNOSIS — Z6839 Body mass index (BMI) 39.0-39.9, adult: Secondary | ICD-10-CM | POA: Diagnosis not present

## 2021-05-28 ENCOUNTER — Telehealth: Payer: Self-pay

## 2021-05-28 ENCOUNTER — Ambulatory Visit (INDEPENDENT_AMBULATORY_CARE_PROVIDER_SITE_OTHER): Payer: PPO

## 2021-05-28 ENCOUNTER — Other Ambulatory Visit: Payer: Self-pay

## 2021-05-28 DIAGNOSIS — R310 Gross hematuria: Secondary | ICD-10-CM | POA: Diagnosis not present

## 2021-05-28 LAB — MICROSCOPIC EXAMINATION
Bacteria, UA: NONE SEEN
RBC, Urine: >30 /hpf — AB (ref 0–2)
Renal Epithel, UA: NONE SEEN /hpf

## 2021-05-28 LAB — URINALYSIS, ROUTINE W REFLEX MICROSCOPIC
Bilirubin, UA: NEGATIVE
Nitrite, UA: NEGATIVE
Specific Gravity, UA: 1.01 (ref 1.005–1.030)
Urobilinogen, Ur: >8 mg/dL — ABNORMAL HIGH (ref 0.2–1.0)
pH, UA: 8.5 — ABNORMAL HIGH (ref 5.0–7.5)

## 2021-05-28 MED ORDER — LIDOCAINE HCL 1 % IJ SOLN
20.0000 mL | Freq: Once | INTRAMUSCULAR | Status: AC
  Administered 2021-05-28: 20 mL

## 2021-05-28 MED ORDER — CEFTRIAXONE SODIUM 1 G IJ SOLR
1.0000 g | Freq: Once | INTRAMUSCULAR | Status: AC
  Administered 2021-05-28: 1 g via INTRAMUSCULAR

## 2021-05-28 NOTE — Patient Instructions (Signed)
Patient  will call scheduling @ [phone] to schedule CT asap.

## 2021-05-28 NOTE — Addendum Note (Signed)
Addended by: Gustavus Messing on: 05/28/2021 01:45 PM   Modules accepted: Orders

## 2021-05-28 NOTE — Progress Notes (Addendum)
1GM IM Rocephin given for hematuria to right buttocks that was prepped with alcohol without difficulty.  Urine sent for culture.   Patient  will call scheduling @ [phone] to schedule CT asap

## 2021-05-28 NOTE — Telephone Encounter (Signed)
Wife stating patient is passing blood, blood clot and is in a lot of pain Please advise.

## 2021-05-28 NOTE — Addendum Note (Signed)
Addended by: Gustavus Messing on: 05/28/2021 11:22 AM   Modules accepted: Orders

## 2021-05-28 NOTE — Telephone Encounter (Signed)
Per Dr. Pete Glatter patient will drop urine off for culture and receive 1 Gm Rocephin. Scheduling called in regard to CT for hematuria work up and they stated message was left for patient on 10/26. Patient will be advised to call scheduling to schedule CT ASAP.

## 2021-05-31 ENCOUNTER — Other Ambulatory Visit: Payer: Self-pay | Admitting: Urology

## 2021-05-31 DIAGNOSIS — R310 Gross hematuria: Secondary | ICD-10-CM

## 2021-05-31 LAB — URINE CULTURE

## 2021-05-31 MED ORDER — CIPROFLOXACIN HCL 500 MG PO TABS: 500.0000 mg | ORAL_TABLET | Freq: Two times a day (BID) | ORAL | 0 refills | Status: AC

## 2021-06-01 ENCOUNTER — Telehealth: Payer: Self-pay

## 2021-06-01 NOTE — Telephone Encounter (Signed)
Patient called and made aware.

## 2021-06-01 NOTE — Telephone Encounter (Signed)
-----   Message from Milderd Meager, MD sent at 05/31/2021 10:17 PM EST ----- Please notify patient that his urine culture shows evidence of a UTI.  I have sent in a rx for Cipro x 10 days for treatment.  Keep appt as scheduled.

## 2021-06-09 ENCOUNTER — Other Ambulatory Visit: Payer: Self-pay

## 2021-06-09 ENCOUNTER — Encounter: Payer: Self-pay | Admitting: Urology

## 2021-06-09 ENCOUNTER — Ambulatory Visit: Payer: PPO | Admitting: Urology

## 2021-06-09 VITALS — BP 114/71 | HR 91

## 2021-06-09 DIAGNOSIS — R310 Gross hematuria: Secondary | ICD-10-CM

## 2021-06-09 DIAGNOSIS — N200 Calculus of kidney: Secondary | ICD-10-CM

## 2021-06-09 DIAGNOSIS — R8289 Other abnormal findings on cytological and histological examination of urine: Secondary | ICD-10-CM | POA: Diagnosis not present

## 2021-06-09 DIAGNOSIS — Z87440 Personal history of urinary (tract) infections: Secondary | ICD-10-CM

## 2021-06-09 MED ORDER — NITROFURANTOIN MONOHYD MACRO 100 MG PO CAPS: 100.0000 mg | ORAL_CAPSULE | Freq: Every day | ORAL | 2 refills | Status: DC

## 2021-06-09 MED ORDER — CIPROFLOXACIN HCL 500 MG PO TABS: 500.0000 mg | ORAL_TABLET | Freq: Once | ORAL | Status: DC

## 2021-06-09 NOTE — Progress Notes (Signed)
Assessment: 1. Gross hematuria   2. History of UTI   3. Nephrolithiasis     Plan: Urine cytology sent today Complete Cipro Continue tamsulosin Begin daily Macrobid for UTI prevention Return to office after CT done on 07/06/21  Chief Complaint:  Chief Complaint  Patient presents with   Hematuria    History of Present Illness:  Xavier Williams is a 60 y.o. year old male who is seen for further evaluation of UTI and gross hematuria.  He was seen in the emergency room at [facility] on 12/24/2020 with a 24-hour history of gross hematuria and dysuria.  He was passing clots.  CT without contrast showed scattered small nonobstructing renal calculi with the largest in the left lower pole measuring 3 mm and a 3.9 cm hypodensity in the upper pole of the left kidney consistent with a simple cyst.  The bladder was unremarkable.  Urinalysis showed >25 WBCs, >25 RBCs, moderate bacteria, nitrite positive.  Urine culture grew >100 K E. coli.  He was treated with Cipro.  He presented to his PCP's office in [place] on 05/01/2021 with 2-day history of dysuria and dark urine.  He was also having some left-sided back and flank pain.  Urine culture grew E. coli.  He was treated with Bactrim x7 days. At his visit on 05/19/21, he was not having any UTI symptoms.  U/A was negative.  PVR = 11 ml. He did have baseline lower urinary tract symptoms of nocturia 2-3 times.  He has been on tamsulosin for several years.  He does have a prior history of ureteroscopic stone manipulation done in [place].  He again had an episode of gross hematuria and dysuria on 05/28/2021.  Urinalysis showed 6-10 WBCs, greater than 30 RBCs.  Culture grew >100 K E. coli.  He was treated with Rocephin followed by Cipro x10 days. He returns today for further evaluation with cystoscopy.   He continues on his Cipro and has approximately 3 days left.  He reports that the gross hematuria has resolved.  His dysuria has improved as well.  He continues  with frequency, hesitancy, and nocturia. AUA score = 16 today. His CT is scheduled for 07/06/2021.  Portions of the above documentation were copied from a prior visit for review purposes only.  Past Medical History:  Past Medical History:  Diagnosis Date   Chronic back pain    DDD (degenerative disc disease), lumbar    Depression    Diabetes mellitus    Hyperlipidemia    Hypertension    Kidney stones    Schizophrenia (HCC)    Sleep apnea     Past Surgical History:  Past Surgical History:  Procedure Laterality Date   CARDIAC CATHETERIZATION  2005   normal coronary arteries   COLONOSCOPY      Allergies:  Allergies  Allergen Reactions   Codeine    Morphine And Related Nausea And Vomiting   Penicillins     Family History:  Family History  Problem Relation Age of Onset   Heart attack Mother     Social History:  Social History   Tobacco Use   Smoking status: Former    Packs/day: 1.00    Types: Cigarettes    Start date: 07/26/1973   Smokeless tobacco: Former    Types: Associate Professor Use: Never used  Substance Use Topics   Alcohol use: No   Drug use: No    ROS: Constitutional:  Negative for fever,  chills, weight loss CV: Negative for chest pain, previous MI, hypertension Respiratory:  Negative for shortness of breath, wheezing, sleep apnea, frequent cough GI:  Negative for nausea, vomiting, bloody stool, GERD  Physical exam: BP 114/71   Pulse 91  GENERAL APPEARANCE:  Well appearing, well developed, well nourished, NAD HEENT:  Atraumatic, normocephalic, oropharynx clear NECK:  Supple without lymphadenopathy or thyromegaly ABDOMEN:  Soft, non-tender, no masses EXTREMITIES:  Moves all extremities well, without clubbing, cyanosis, or edema NEUROLOGIC:  Alert and oriented x 3, normal gait, CN II-XII grossly intact MENTAL STATUS:  appropriate BACK:  Non-tender to palpation, No CVAT SKIN:  Warm, dry, and intact   Results: U/A: 0-5 WBC, 0 RBC,  few bacteria  Procedure:  Flexible Cystourethroscopy  Pre-operative Diagnosis: Gross hematuria, UTI  Post-operative Diagnosis: Gross hematuria, UTI  Anesthesia:  local with lidocaine jelly  Surgical Narrative:  After appropriate informed consent was obtained, the patient was prepped and draped in the usual sterile fashion in the supine position.  He was correctly identified and the proper procedure delineated prior to proceeding.  Sterile lidocaine gel was instilled in the urethra. The flexible cystoscope was introduced without difficulty.  Findings:  Anterior urethra: Normal  Posterior urethra: Lateral lobe hypertrophy  Bladder:  scattered areas of increased vascularity, no papillary lesions  Ureteral orifices: normal  Additional findings: none  Saline bladder wash for cytology was performed.    The cystoscope was then removed.  The patient tolerated the procedure well.

## 2021-06-09 NOTE — Progress Notes (Signed)
Urological Symptom Review  Patient is experiencing the following symptoms: Burning/pain with urination Get up at night to urinate Leakage of urine Erection problems (male only)   Review of Systems  Gastrointestinal (upper)  : Negative for upper GI symptoms  Gastrointestinal (lower) : Negative for lower GI symptoms  Constitutional : Night Sweats  Skin: Negative for skin symptoms  Eyes: Negative for eye symptoms  Ear/Nose/Throat : Sinus problems  Hematologic/Lymphatic: Negative for Hematologic/Lymphatic symptoms  Cardiovascular : Negative for cardiovascular symptoms  Respiratory : Cough  Endocrine: Negative for endocrine symptoms  Musculoskeletal: Back pain Joint pain  Neurological: Negative for neurological symptoms  Psychologic: Depression Anxiety

## 2021-06-10 LAB — CYTOLOGY, URINE

## 2021-06-10 LAB — MICROSCOPIC EXAMINATION: RBC, Urine: NONE SEEN /hpf (ref 0–2)

## 2021-06-10 LAB — URINALYSIS, ROUTINE W REFLEX MICROSCOPIC
Bilirubin, UA: NEGATIVE
Glucose, UA: NEGATIVE
Leukocytes,UA: NEGATIVE
Nitrite, UA: NEGATIVE
Protein,UA: NEGATIVE
Specific Gravity, UA: >1.03 — ABNORMAL HIGH (ref 1.005–1.030)
Urobilinogen, Ur: 1 mg/dL (ref 0.2–1.0)
pH, UA: 5.5 (ref 5.0–7.5)

## 2021-06-15 ENCOUNTER — Other Ambulatory Visit: Payer: Self-pay

## 2021-06-15 NOTE — Progress Notes (Signed)
Opened in error

## 2021-06-23 DIAGNOSIS — G4733 Obstructive sleep apnea (adult) (pediatric): Secondary | ICD-10-CM | POA: Diagnosis not present

## 2021-06-25 DIAGNOSIS — I700 Atherosclerosis of aorta: Secondary | ICD-10-CM | POA: Diagnosis not present

## 2021-06-25 DIAGNOSIS — M25561 Pain in right knee: Secondary | ICD-10-CM | POA: Diagnosis not present

## 2021-06-25 DIAGNOSIS — F339 Major depressive disorder, recurrent, unspecified: Secondary | ICD-10-CM | POA: Diagnosis not present

## 2021-06-25 DIAGNOSIS — I10 Essential (primary) hypertension: Secondary | ICD-10-CM | POA: Diagnosis not present

## 2021-06-25 DIAGNOSIS — K219 Gastro-esophageal reflux disease without esophagitis: Secondary | ICD-10-CM | POA: Diagnosis not present

## 2021-06-25 DIAGNOSIS — J449 Chronic obstructive pulmonary disease, unspecified: Secondary | ICD-10-CM | POA: Diagnosis not present

## 2021-06-25 DIAGNOSIS — E1169 Type 2 diabetes mellitus with other specified complication: Secondary | ICD-10-CM | POA: Diagnosis not present

## 2021-06-25 DIAGNOSIS — M25562 Pain in left knee: Secondary | ICD-10-CM | POA: Diagnosis not present

## 2021-07-02 DIAGNOSIS — R519 Headache, unspecified: Secondary | ICD-10-CM | POA: Diagnosis not present

## 2021-07-02 DIAGNOSIS — F419 Anxiety disorder, unspecified: Secondary | ICD-10-CM | POA: Diagnosis not present

## 2021-07-02 DIAGNOSIS — Z8679 Personal history of other diseases of the circulatory system: Secondary | ICD-10-CM | POA: Diagnosis not present

## 2021-07-02 DIAGNOSIS — G4733 Obstructive sleep apnea (adult) (pediatric): Secondary | ICD-10-CM | POA: Diagnosis not present

## 2021-07-02 DIAGNOSIS — E785 Hyperlipidemia, unspecified: Secondary | ICD-10-CM | POA: Diagnosis not present

## 2021-07-02 DIAGNOSIS — R269 Unspecified abnormalities of gait and mobility: Secondary | ICD-10-CM | POA: Diagnosis not present

## 2021-07-02 DIAGNOSIS — M25552 Pain in left hip: Secondary | ICD-10-CM | POA: Diagnosis not present

## 2021-07-02 DIAGNOSIS — M545 Low back pain, unspecified: Secondary | ICD-10-CM | POA: Diagnosis not present

## 2021-07-02 DIAGNOSIS — G4700 Insomnia, unspecified: Secondary | ICD-10-CM | POA: Diagnosis not present

## 2021-07-02 DIAGNOSIS — M25551 Pain in right hip: Secondary | ICD-10-CM | POA: Diagnosis not present

## 2021-07-02 DIAGNOSIS — R42 Dizziness and giddiness: Secondary | ICD-10-CM | POA: Diagnosis not present

## 2021-07-02 DIAGNOSIS — G5600 Carpal tunnel syndrome, unspecified upper limb: Secondary | ICD-10-CM | POA: Diagnosis not present

## 2021-07-06 ENCOUNTER — Other Ambulatory Visit: Payer: Self-pay

## 2021-07-06 ENCOUNTER — Encounter (HOSPITAL_COMMUNITY): Payer: Self-pay

## 2021-07-06 ENCOUNTER — Ambulatory Visit (HOSPITAL_COMMUNITY)
Admission: RE | Admit: 2021-07-06 | Discharge: 2021-07-06 | Disposition: A | Discharge status: Home / Self Care | Payer: PPO | Source: Ambulatory Visit | Attending: Urology | Admitting: Urology

## 2021-07-06 DIAGNOSIS — N281 Cyst of kidney, acquired: Secondary | ICD-10-CM | POA: Diagnosis not present

## 2021-07-06 DIAGNOSIS — I700 Atherosclerosis of aorta: Secondary | ICD-10-CM | POA: Diagnosis not present

## 2021-07-06 DIAGNOSIS — N200 Calculus of kidney: Secondary | ICD-10-CM | POA: Diagnosis not present

## 2021-07-06 DIAGNOSIS — R310 Gross hematuria: Secondary | ICD-10-CM | POA: Insufficient documentation

## 2021-07-06 LAB — POCT I-STAT CREATININE: Creatinine, Ser: 1.2 mg/dL (ref 0.61–1.24)

## 2021-07-06 MED ORDER — IOHEXOL 300 MG/ML  SOLN
100.0000 mL | Freq: Once | INTRAMUSCULAR | Status: AC | PRN
  Administered 2021-07-06: 100 mL via INTRAVENOUS

## 2021-07-07 ENCOUNTER — Ambulatory Visit: Payer: PPO | Admitting: Urology

## 2021-07-07 ENCOUNTER — Encounter: Payer: Self-pay | Admitting: Urology

## 2021-07-07 VITALS — BP 124/72 | HR 98

## 2021-07-07 DIAGNOSIS — R310 Gross hematuria: Secondary | ICD-10-CM | POA: Diagnosis not present

## 2021-07-07 DIAGNOSIS — N200 Calculus of kidney: Secondary | ICD-10-CM

## 2021-07-07 DIAGNOSIS — Z87440 Personal history of urinary (tract) infections: Secondary | ICD-10-CM | POA: Diagnosis not present

## 2021-07-07 LAB — URINALYSIS, ROUTINE W REFLEX MICROSCOPIC
Bilirubin, UA: NEGATIVE
Ketones, UA: NEGATIVE
Leukocytes,UA: NEGATIVE
Nitrite, UA: NEGATIVE
Protein,UA: NEGATIVE
Specific Gravity, UA: 1.02 (ref 1.005–1.030)
Urobilinogen, Ur: 1 mg/dL (ref 0.2–1.0)
pH, UA: 5.5 (ref 5.0–7.5)

## 2021-07-07 LAB — MICROSCOPIC EXAMINATION
Bacteria, UA: NONE SEEN
Epithelial Cells (non renal): NONE SEEN /hpf (ref 0–10)
RBC, Urine: NONE SEEN /hpf (ref 0–2)
Renal Epithel, UA: NONE SEEN /hpf
WBC, UA: NONE SEEN /hpf (ref 0–5)

## 2021-07-07 NOTE — Progress Notes (Signed)
Assessment: 1. Gross hematuria   2. History of UTI   3. Nephrolithiasis     Plan: I personally reviewed the CT study from 07/06/2021 showing small bilateral nonobstructing renal calculi and a simple appearing left renal cyst.  Results discussed with the patient today. I reviewed the findings for his hematuria evaluation.  No serious or life-threatening causes identified. Continue tamsulosin Continue daily Macrobid for UTI prevention Return to office in 2 months  Chief Complaint:  Chief Complaint  Patient presents with   Hematuria     History of Present Illness:  Xavier Williams is a 60 y.o. year old male who is seen for further evaluation of UTI and gross hematuria.  He was seen in the emergency room at [facility] on 12/24/2020 with a 24-hour history of gross hematuria and dysuria.  He was passing clots.  CT without contrast showed scattered small nonobstructing renal calculi with the largest in the left lower pole measuring 3 mm and a 3.9 cm hypodensity in the upper pole of the left kidney consistent with a simple cyst.  The bladder was unremarkable.  Urinalysis showed >25 WBCs, >25 RBCs, moderate bacteria, nitrite positive.  Urine culture grew >100 K E. coli.  He was treated with Cipro.  He presented to his PCP's office in [place] on 05/01/2021 with 2-day history of dysuria and dark urine.  He was also having some left-sided back and flank pain.  Urine culture grew E. coli.  He was treated with Bactrim x7 days. At his visit on 05/19/21, he was not having any UTI symptoms.  U/A was negative.  PVR = 11 ml. He did have baseline lower urinary tract symptoms of nocturia 2-3 times.  He has been on tamsulosin for several years.  He does have a prior history of ureteroscopic stone manipulation done in [place].  He again had an episode of gross hematuria and dysuria on 05/28/2021.  Urinalysis showed 6-10 WBCs, greater than 30 RBCs.  Culture grew >100 K E. coli.  He was treated with Rocephin followed  by Cipro x10 days. He completed his Cipro. He reports that the gross hematuria has resolved.  His dysuria improved as well.  He continued with frequency, hesitancy, and nocturia. AUA score = 16. Cystoscopy from 06/09/2021 demonstrated lateral lobe enlargement of the prostate, several areas of increased vascularity within the bladder, no obvious papillary lesions or other abnormalities. Urine cytology showed atypical cells. He was started on daily Macrobid for UTI prevention. CT hematuria protocol from 07/07/2021 showed several small renal calculi bilaterally, no evidence of hydronephrosis, 3.5 cm simple cyst in the upper pole of the left kidney.  He returns today for follow-up.  He continues on daily Macrobid and tamsulosin.  He reports feeling well.  No further gross hematuria.  His lower urinary tract symptoms are improved.  He does report some frequency nocturia. AUA score = 9 today.   Portions of the above documentation were copied from a prior visit for review purposes only.  Past Medical History:  Past Medical History:  Diagnosis Date   Chronic back pain    DDD (degenerative disc disease), lumbar    Depression    Diabetes mellitus    Hyperlipidemia    Hypertension    Kidney stones    Schizophrenia (HCC)    Sleep apnea     Past Surgical History:  Past Surgical History:  Procedure Laterality Date   CARDIAC CATHETERIZATION  2005   normal coronary arteries   COLONOSCOPY  Allergies:  Allergies  Allergen Reactions   Codeine    Morphine And Related Nausea And Vomiting   Penicillins     Family History:  Family History  Problem Relation Age of Onset   Heart attack Mother     Social History:  Social History   Tobacco Use   Smoking status: Former    Packs/day: 1.00    Types: Cigarettes    Start date: 07/26/1973   Smokeless tobacco: Former    Types: Associate Professor Use: Never used  Substance Use Topics   Alcohol use: No   Drug use: No     ROS: Constitutional:  Negative for fever, chills, weight loss CV: Negative for chest pain, previous MI, hypertension Respiratory:  Negative for shortness of breath, wheezing, sleep apnea, frequent cough GI:  Negative for nausea, vomiting, bloody stool, GERD  Physical exam: BP 124/72    Pulse 98  GENERAL APPEARANCE:  Well appearing, well developed, well nourished, NAD HEENT:  Atraumatic, normocephalic, oropharynx clear NECK:  Supple without lymphadenopathy or thyromegaly ABDOMEN:  Soft, non-tender, no masses EXTREMITIES:  Moves all extremities well, without clubbing, cyanosis, or edema NEUROLOGIC:  Alert and oriented x 3, normal gait, CN II-XII grossly intact MENTAL STATUS:  appropriate BACK:  Non-tender to palpation, No CVAT SKIN:  Warm, dry, and intact  Results: U/A:  dipstick negative

## 2021-07-07 NOTE — Progress Notes (Signed)
Urological Symptom Review  Patient is experiencing the following symptoms: Frequent urination Get up at night to urinate Leakage of urine Urinary tract infection   Review of Systems  Gastrointestinal (upper)  : Negative for upper GI symptoms  Gastrointestinal (lower) : Negative for lower GI symptoms  Constitutional : Negative for symptoms  Skin: Negative for skin symptoms  Eyes: Negative for eye symptoms  Ear/Nose/Throat : Sinus problems  Hematologic/Lymphatic: Negative for Hematologic/Lymphatic symptoms  Cardiovascular : Leg swelling  Respiratory : Shortness of breath  Endocrine: Negative for endocrine symptoms  Musculoskeletal: Back pain Joint pain  Neurological: Headaches Dizziness  Psychologic: Negative for psychiatric symptoms

## 2021-09-01 ENCOUNTER — Ambulatory Visit: Payer: PPO | Admitting: Urology

## 2021-09-01 NOTE — Progress Notes (Deleted)
Assessment: 1. Gross hematuria   2. History of UTI   3. Nephrolithiasis      Plan: Continue tamsulosin Continue daily Macrobid for UTI prevention Return to office in 2 months  Chief Complaint:  No chief complaint on file.    History of Present Illness:  Xavier Williams is a 61 y.o. year old male who is seen for further evaluation of UTI and gross hematuria.  He was seen in the emergency room at [facility] on 12/24/2020 with a 24-hour history of gross hematuria and dysuria.  He was passing clots.  CT without contrast showed scattered small nonobstructing renal calculi with the largest in the left lower pole measuring 3 mm and a 3.9 cm hypodensity in the upper pole of the left kidney consistent with a simple cyst.  The bladder was unremarkable.  Urinalysis showed >25 WBCs, >25 RBCs, moderate bacteria, nitrite positive.  Urine culture grew >100 K E. coli.  He was treated with Cipro.  He presented to his PCP's office in [place] on 05/01/2021 with 2-day history of dysuria and dark urine.  He was also having some left-sided back and flank pain.  Urine culture grew E. coli.  He was treated with Bactrim x7 days. At his visit on 05/19/21, he was not having any UTI symptoms.  U/A was negative.  PVR = 11 ml. He did have baseline lower urinary tract symptoms of nocturia 2-3 times.  He has been on tamsulosin for several years.  He does have a prior history of ureteroscopic stone manipulation done in [place].  He again had an episode of gross hematuria and dysuria on 05/28/2021.  Urinalysis showed 6-10 WBCs, greater than 30 RBCs.  Culture grew >100 K E. coli.  He was treated with Rocephin followed by Cipro x10 days. He completed his Cipro. He reports that the gross hematuria has resolved.  His dysuria improved as well.  He continued with frequency, hesitancy, and nocturia. AUA score = 16. Cystoscopy from 06/09/2021 demonstrated lateral lobe enlargement of the prostate, several areas of increased  vascularity within the bladder, no obvious papillary lesions or other abnormalities. Urine cytology showed atypical cells. He was started on daily Macrobid for UTI prevention. CT hematuria protocol from 07/07/2021 showed several small renal calculi bilaterally, no evidence of hydronephrosis, 3.5 cm simple cyst in the upper pole of the left kidney.  At his visit in December 2022, he continued on daily Macrobid and tamsulosin.  No further gross hematuria.  His lower urinary tract symptoms were improved.  He reported some frequency and nocturia. AUA score = 9.   Portions of the above documentation were copied from a prior visit for review purposes only.  Past Medical History:  Past Medical History:  Diagnosis Date   Chronic back pain    DDD (degenerative disc disease), lumbar    Depression    Diabetes mellitus    Hyperlipidemia    Hypertension    Kidney stones    Schizophrenia ([place])    Sleep apnea     Past Surgical History:  Past Surgical History:  Procedure Laterality Date   CARDIAC CATHETERIZATION  2005   normal coronary arteries   COLONOSCOPY      Allergies:  Allergies  Allergen Reactions   Codeine    Morphine And Related Nausea And Vomiting   Penicillins     Family History:  Family History  Problem Relation Age of Onset   Heart attack Mother     Social History:  Social  History   Tobacco Use   Smoking status: Former    Packs/day: 1.00    Types: Cigarettes    Start date: 07/26/1973   Smokeless tobacco: Former    Types: Nurse, children's Use: Never used  Substance Use Topics   Alcohol use: No   Drug use: No    ROS: Constitutional:  Negative for fever, chills, weight loss CV: Negative for chest pain, previous MI, hypertension Respiratory:  Negative for shortness of breath, wheezing, sleep apnea, frequent cough GI:  Negative for nausea, vomiting, bloody stool, GERD  Physical exam: There were no vitals taken for this  visit. ***  Results: U/A: ***

## 2021-09-04 ENCOUNTER — Telehealth: Payer: Self-pay

## 2021-09-04 DIAGNOSIS — Z87440 Personal history of urinary (tract) infections: Secondary | ICD-10-CM

## 2021-09-04 MED ORDER — NITROFURANTOIN MONOHYD MACRO 100 MG PO CAPS: 100.0000 mg | ORAL_CAPSULE | Freq: Every day | ORAL | 2 refills | Status: DC

## 2021-09-04 NOTE — Telephone Encounter (Signed)
Patient needing refill on medication.   Medication:  nitrofurantoin, macrocrystal-monohydrate, (MACROBID) 100 MG capsule   Pharmacy: [facility] - [place], [place] - [address]

## 2021-09-04 NOTE — Telephone Encounter (Signed)
Refill submitted. 

## 2021-12-09 ENCOUNTER — Other Ambulatory Visit: Payer: Self-pay

## 2021-12-09 DIAGNOSIS — Z87440 Personal history of urinary (tract) infections: Secondary | ICD-10-CM

## 2021-12-09 MED ORDER — NITROFURANTOIN MONOHYD MACRO 100 MG PO CAPS: 100.0000 mg | ORAL_CAPSULE | Freq: Every day | ORAL | 2 refills | Status: DC

## 2021-12-24 ENCOUNTER — Ambulatory Visit (INDEPENDENT_AMBULATORY_CARE_PROVIDER_SITE_OTHER): Payer: PPO | Admitting: Urology

## 2021-12-24 ENCOUNTER — Encounter: Payer: Self-pay | Admitting: Urology

## 2021-12-24 VITALS — BP 97/58 | HR 65

## 2021-12-24 DIAGNOSIS — R310 Gross hematuria: Secondary | ICD-10-CM

## 2021-12-24 DIAGNOSIS — Z87440 Personal history of urinary (tract) infections: Secondary | ICD-10-CM

## 2021-12-24 DIAGNOSIS — N138 Other obstructive and reflux uropathy: Secondary | ICD-10-CM

## 2021-12-24 DIAGNOSIS — N401 Enlarged prostate with lower urinary tract symptoms: Secondary | ICD-10-CM | POA: Diagnosis not present

## 2021-12-24 DIAGNOSIS — N200 Calculus of kidney: Secondary | ICD-10-CM | POA: Diagnosis not present

## 2021-12-24 LAB — URINALYSIS, ROUTINE W REFLEX MICROSCOPIC
Bilirubin, UA: NEGATIVE
Glucose, UA: NEGATIVE
Ketones, UA: NEGATIVE
Leukocytes,UA: NEGATIVE
Nitrite, UA: NEGATIVE
Protein,UA: NEGATIVE
RBC, UA: NEGATIVE
Specific Gravity, UA: 1.015 (ref 1.005–1.030)
Urobilinogen, Ur: 2 mg/dL — ABNORMAL HIGH (ref 0.2–1.0)
pH, UA: 5.5 (ref 5.0–7.5)

## 2021-12-24 NOTE — Progress Notes (Signed)
Assessment: 1. History of UTI   2. Gross hematuria   3. Nephrolithiasis   4. BPH with obstruction/lower urinary tract symptoms      Plan: Continue tamsulosin PSA today Will discontinue daily Macrobid to see if he has a recurrence of his UTIs off of the prophylactic antibiotic. Return to office in 2 months.  Chief Complaint:  Chief Complaint  Patient presents with   Hematuria    History of Present Illness:  Xavier Williams is a 61 y.o. year old male who is seen for further evaluation of UTI and gross hematuria.  He was seen in the emergency room at [facility] on 12/24/2020 with a 24-hour history of gross hematuria and dysuria.  He was passing clots.  CT without contrast showed scattered small nonobstructing renal calculi with the largest in the left lower pole measuring 3 mm and a 3.9 cm hypodensity in the upper pole of the left kidney consistent with a simple cyst.  The bladder was unremarkable.  Urinalysis showed >25 WBCs, >25 RBCs, moderate bacteria, nitrite positive.  Urine culture grew >100 K E. coli.  He was treated with Cipro.  He presented to his PCP's office in [place] on 05/01/2021 with 2-day history of dysuria and dark urine.  He was also having some left-sided back and flank pain.  Urine culture grew E. coli.  He was treated with Bactrim x7 days. At his visit on 05/19/21, he was not having any UTI symptoms.  U/A was negative.  PVR = 11 ml. He did have baseline lower urinary tract symptoms of nocturia 2-3 times.  He has been on tamsulosin for several years.  He does have a prior history of ureteroscopic stone manipulation done in [place].  He again had an episode of gross hematuria and dysuria on 05/28/2021.  Urinalysis showed 6-10 WBCs, greater than 30 RBCs.  Culture grew >100 K E. coli.  He was treated with Rocephin followed by Cipro x10 days. He completed his Cipro. He reports that the gross hematuria has resolved.  His dysuria improved as well.  He continued with frequency,  hesitancy, and nocturia. AUA score = 16. Cystoscopy from 06/09/2021 demonstrated lateral lobe enlargement of the prostate, several areas of increased vascularity within the bladder, no obvious papillary lesions or other abnormalities. Urine cytology showed atypical cells. He was started on daily Macrobid for UTI prevention. CT hematuria protocol from 07/07/2021 showed several small renal calculi bilaterally, no evidence of hydronephrosis, 3.5 cm simple cyst in the upper pole of the left kidney.  At his visit in 12/22, he continued on daily Macrobid and tamsulosin.  No further gross hematuria.  His lower urinary tract symptoms were improved.  He reported some frequency and nocturia. AUA score = 9.  He returns today for follow-up.  He continues on tamsulosin and daily Macrobid.  His urinary symptoms remain stable.  No dysuria or gross hematuria. IPSS = 3 today.   Portions of the above documentation were copied from a prior visit for review purposes only.  Past Medical History:  Past Medical History:  Diagnosis Date   Chronic back pain    DDD (degenerative disc disease), lumbar    Depression    Diabetes mellitus    Hyperlipidemia    Hypertension    Kidney stones    Schizophrenia ([place])    Sleep apnea     Past Surgical History:  Past Surgical History:  Procedure Laterality Date   CARDIAC CATHETERIZATION  2005   normal coronary arteries  COLONOSCOPY      Allergies:  Allergies  Allergen Reactions   Codeine    Morphine And Related Nausea And Vomiting   Penicillins     Family History:  Family History  Problem Relation Age of Onset   Heart attack Mother     Social History:  Social History   Tobacco Use   Smoking status: Former    Packs/day: 1.00    Types: Cigarettes    Start date: 07/26/1973   Smokeless tobacco: Former    Types: Nurse, children's Use: Never used  Substance Use Topics   Alcohol use: No   Drug use: No    ROS: Constitutional:  Negative  for fever, chills, weight loss CV: Negative for chest pain, previous MI, hypertension Respiratory:  Negative for shortness of breath, wheezing, sleep apnea, frequent cough GI:  Negative for nausea, vomiting, bloody stool, GERD  Physical exam: BP (!) 97/58   Pulse 65  GENERAL APPEARANCE:  Well appearing, well developed, well nourished, NAD HEENT:  Atraumatic, normocephalic, oropharynx clear NECK:  Supple without lymphadenopathy or thyromegaly ABDOMEN:  Soft, non-tender, no masses EXTREMITIES:  Moves all extremities well, without clubbing, cyanosis, or edema NEUROLOGIC:  Alert and oriented x 3, normal gait, CN II-XII grossly intact MENTAL STATUS:  appropriate BACK:  Non-tender to palpation, No CVAT SKIN:  Warm, dry, and intact  Results: U/A: Dipstick negative

## 2021-12-25 LAB — PSA: Prostate Specific Ag, Serum: 0.7 ng/mL (ref 0.0–4.0)

## 2021-12-25 NOTE — Progress Notes (Signed)
Letter sent.

## 2022-02-25 ENCOUNTER — Ambulatory Visit: Payer: PPO | Admitting: Urology

## 2022-02-25 NOTE — Progress Notes (Deleted)
Assessment: 1. History of UTI   2. Gross hematuria   3. Nephrolithiasis   4. BPH with obstruction/lower urinary tract symptoms     Plan: Continue tamsulosin PSA today Will discontinue daily Macrobid to see if he has a recurrence of his UTIs off of the prophylactic antibiotic. Return to office in 2 months.  Chief Complaint:  No chief complaint on file.   History of Present Illness:  Xavier Williams is a 61 y.o. year old male who is seen for further evaluation of UTI and gross hematuria.  He was seen in the emergency room at [facility] on 12/24/2020 with a 24-hour history of gross hematuria and dysuria.  He was passing clots.  CT without contrast showed scattered small nonobstructing renal calculi with the largest in the left lower pole measuring 3 mm and a 3.9 cm hypodensity in the upper pole of the left kidney consistent with a simple cyst.  The bladder was unremarkable.  Urinalysis showed >25 WBCs, >25 RBCs, moderate bacteria, nitrite positive.  Urine culture grew >100 K E. coli.  He was treated with Cipro.  He presented to his PCP's office in [place] on 05/01/2021 with 2-day history of dysuria and dark urine.  He was also having some left-sided back and flank pain.  Urine culture grew E. coli.  He was treated with Bactrim x7 days. At his visit on 05/19/21, he was not having any UTI symptoms.  U/A was negative.  PVR = 11 ml. He did have baseline lower urinary tract symptoms of nocturia 2-3 times.  He has been on tamsulosin for several years.  He does have a prior history of ureteroscopic stone manipulation done in [place].  He again had an episode of gross hematuria and dysuria on 05/28/2021.  Urinalysis showed 6-10 WBCs, greater than 30 RBCs.  Culture grew >100 K E. coli.  He was treated with Rocephin followed by Cipro x10 days. He completed his Cipro. He reports that the gross hematuria has resolved.  His dysuria improved as well.  He continued with frequency, hesitancy, and nocturia. AUA  score = 16. Cystoscopy from 06/09/2021 demonstrated lateral lobe enlargement of the prostate, several areas of increased vascularity within the bladder, no obvious papillary lesions or other abnormalities. Urine cytology showed atypical cells. He was started on daily Macrobid for UTI prevention. CT hematuria protocol from 07/07/2021 showed several small renal calculi bilaterally, no evidence of hydronephrosis, 3.5 cm simple cyst in the upper pole of the left kidney.  At his visit in 12/22, he continued on daily Macrobid and tamsulosin.  No further gross hematuria.  His lower urinary tract symptoms were improved.  He reported some frequency and nocturia. AUA score = 9.  At his visit in June 2023, he continued on tamsulosin and daily Macrobid.  His urinary symptoms remained stable.  No dysuria or gross hematuria. IPSS = 3.    Portions of the above documentation were copied from a prior visit for review purposes only.  Past Medical History:  Past Medical History:  Diagnosis Date   Chronic back pain    DDD (degenerative disc disease), lumbar    Depression    Diabetes mellitus    Hyperlipidemia    Hypertension    Kidney stones    Schizophrenia ([facility])    Sleep apnea     Past Surgical History:  Past Surgical History:  Procedure Laterality Date   CARDIAC CATHETERIZATION  2005   normal coronary arteries   COLONOSCOPY  Allergies:  Allergies  Allergen Reactions   Codeine    Morphine And Related Nausea And Vomiting   Penicillins     Family History:  Family History  Problem Relation Age of Onset   Heart attack Mother     Social History:  Social History   Tobacco Use   Smoking status: Former    Packs/day: 1.00    Types: Cigarettes    Start date: 07/26/1973   Smokeless tobacco: Former    Types: Associate Professor Use: Never used  Substance Use Topics   Alcohol use: No   Drug use: No    ROS: Constitutional:  Negative for fever, chills, weight loss CV:  Negative for chest pain, previous MI, hypertension Respiratory:  Negative for shortness of breath, wheezing, sleep apnea, frequent cough GI:  Negative for nausea, vomiting, bloody stool, GERD  Physical exam: There were no vitals taken for this visit. GENERAL APPEARANCE:  Well appearing, well developed, well nourished, NAD HEENT:  Atraumatic, normocephalic, oropharynx clear NECK:  Supple without lymphadenopathy or thyromegaly ABDOMEN:  Soft, non-tender, no masses EXTREMITIES:  Moves all extremities well, without clubbing, cyanosis, or edema NEUROLOGIC:  Alert and oriented x 3, normal gait, CN II-XII grossly intact MENTAL STATUS:  appropriate BACK:  Non-tender to palpation, No CVAT SKIN:  Warm, dry, and intact  Results: U/A:

## 2022-04-12 ENCOUNTER — Telehealth: Payer: Self-pay | Admitting: Pulmonary Disease

## 2022-04-12 MED ORDER — TRELEGY ELLIPTA 100-62.5-25 MCG/ACT IN AEPB: 1.0000 | INHALATION_SPRAY | Freq: Every day | RESPIRATORY_TRACT | 0 refills | Status: AC

## 2022-04-12 NOTE — Telephone Encounter (Signed)
Pharmacy called on pt's behalf. Pt needs refill of trellogy. LOV July 2022. Informed the pharmacist that I would place message back for this refill, but that pt will need to call and schedule a f/u as it's been over a year since his appt. Pharmacy is [facility]

## 2022-04-12 NOTE — Telephone Encounter (Signed)
1 refill sent in but placed in notes that PT would need follow up for more refills. Nothing further needed

## 2022-04-27 IMAGING — CT CT ABD-PEL WO/W CM
3 of 11 series · 12 of 46 positions shown, 18 images · IV contrast (Omnipaque or Isovue)
Comparison: 10/24/2015

CLINICAL DATA: Painless gross hematuria.

EXAM:
CT ABDOMEN AND PELVIS WITHOUT AND WITH CONTRAST
TECHNIQUE: Multidetector CT imaging of the abdomen and pelvis was performed
following the standard protocol before and following the bolus
administration of intravenous contrast.
CONTRAST:  100mL OMNIPAQUE IOHEXOL 300 MG/ML  SOLN

[Series 2: axial pre · axial · non-contrast · 0.98mm/px · z∈[-220,+135]mm · 5 of 107 slices shown, 10 images]
[im 18/107  soft-tissue]
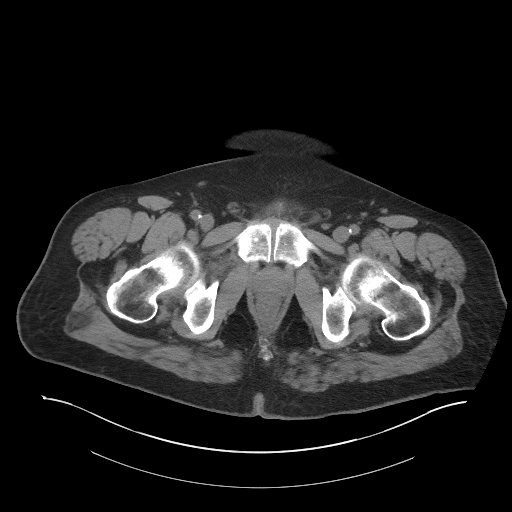
[im 18/107  bone]
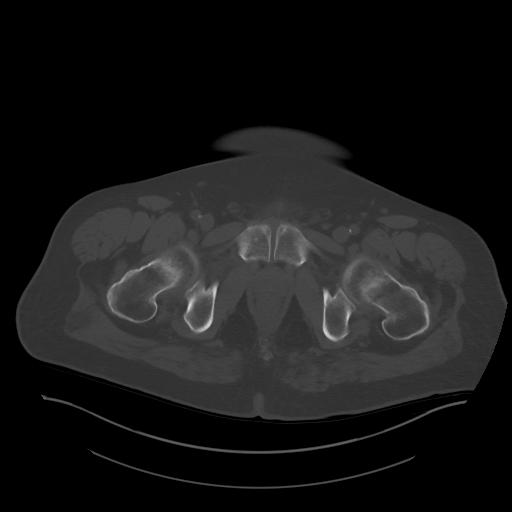
[im 36/107  soft-tissue]
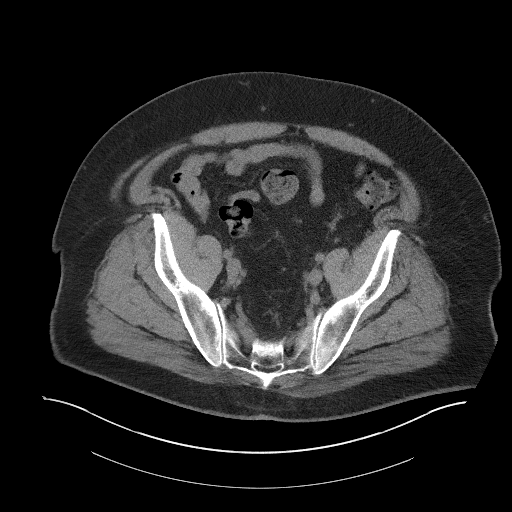
[im 36/107  lung]
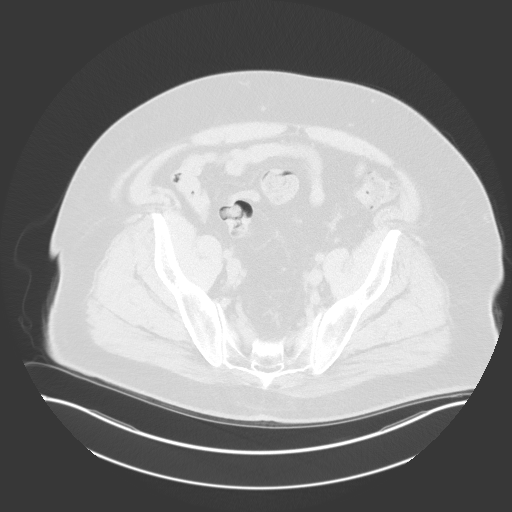
[im 54/107  soft-tissue]
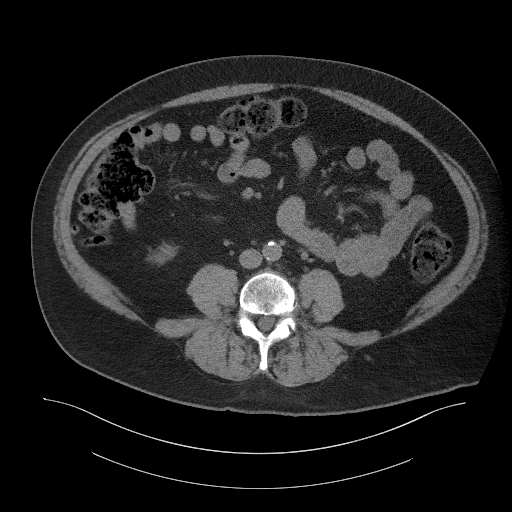
[im 54/107  lung]
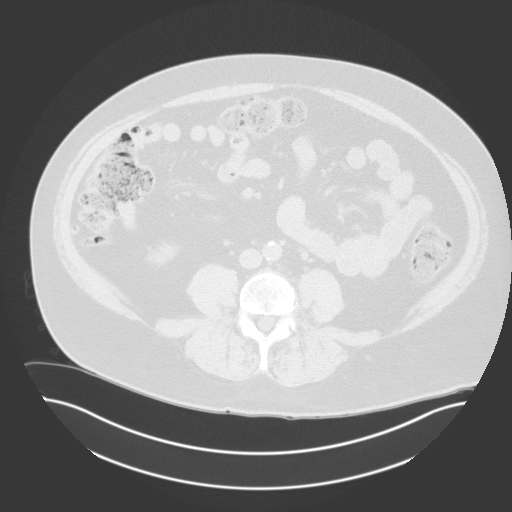
[im 71/107  soft-tissue]
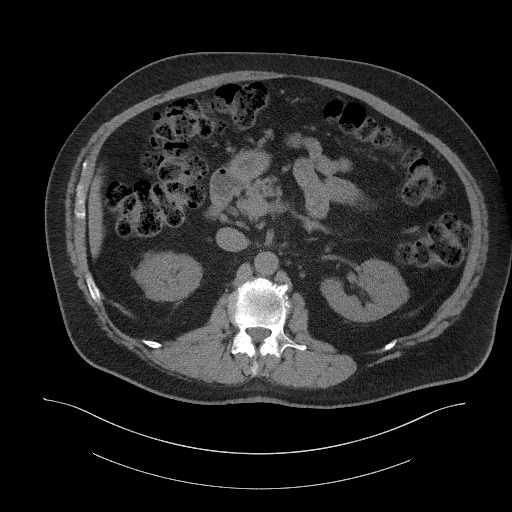
[im 71/107  lung]
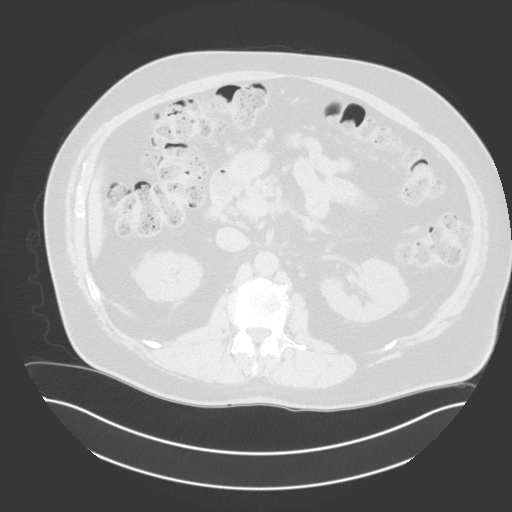
[im 89/107  soft-tissue]
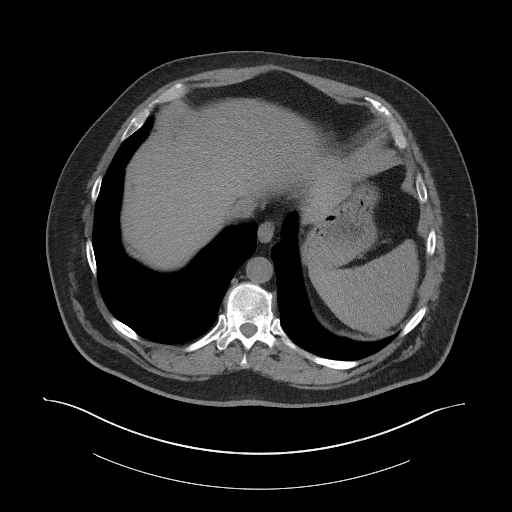
[im 89/107  lung]
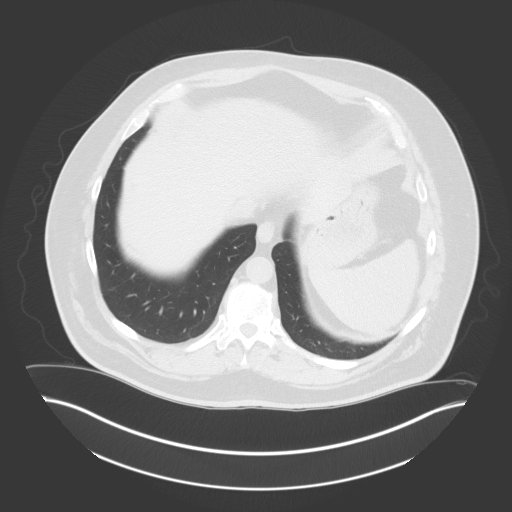

[Series 5: coronal pre · coronal · non-contrast · 1.09mm/px · 2 of 134 slices shown, 3 images]
[im 45/134  soft-tissue]
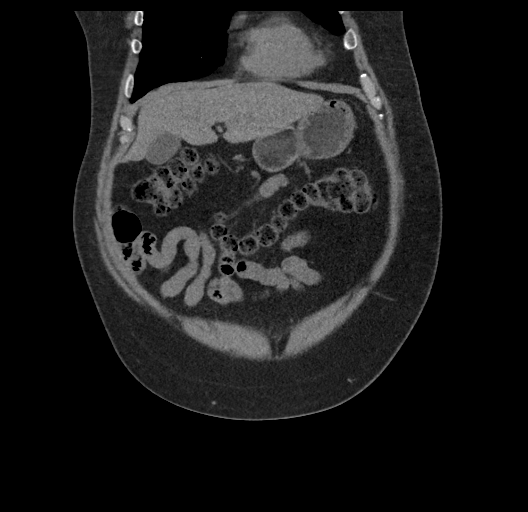
[im 45/134  bone]
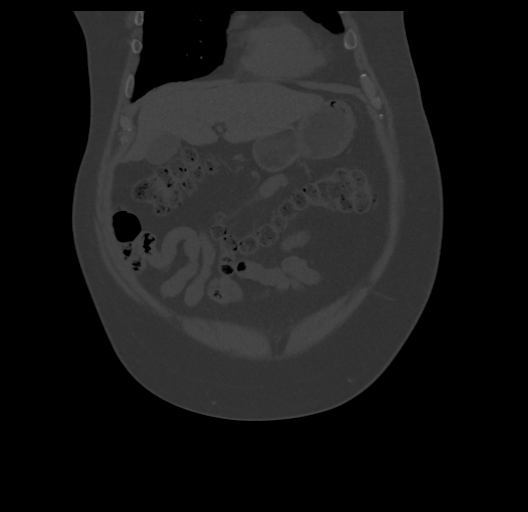
[im 89/134  soft-tissue]
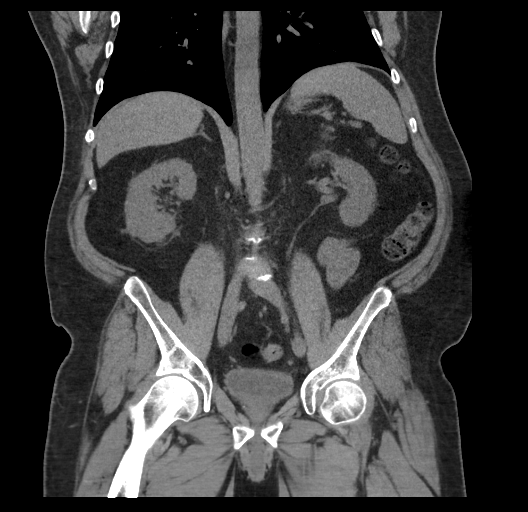

[Series 7: axial post · axial · 0.98mm/px · z∈[-220,+135]mm · 5 of 107 slices shown]
[im 18/107  soft-tissue]
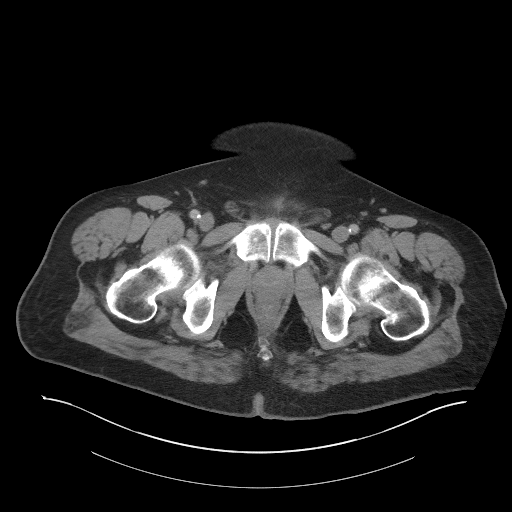
[im 36/107  soft-tissue]
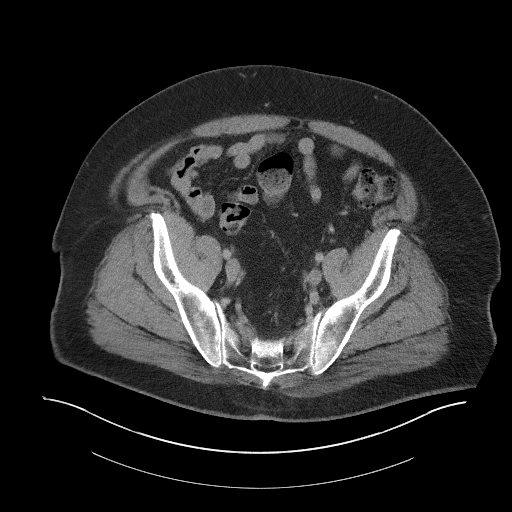
[im 54/107  soft-tissue]
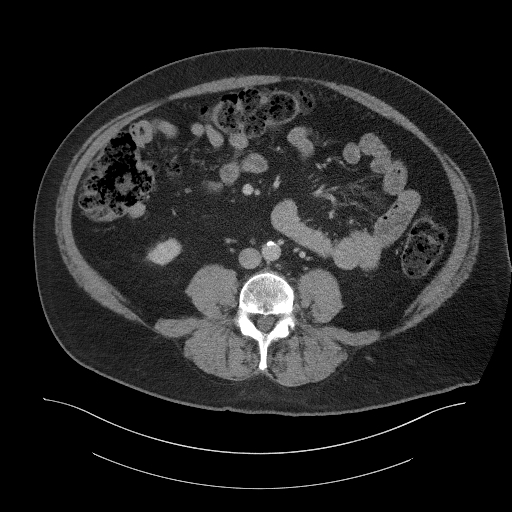
[im 71/107  soft-tissue]
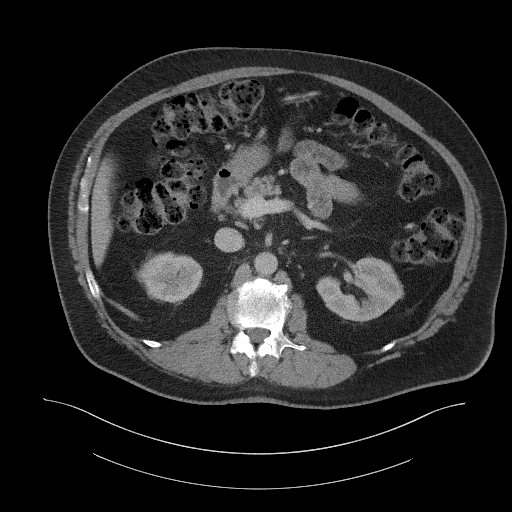
[im 89/107  soft-tissue]
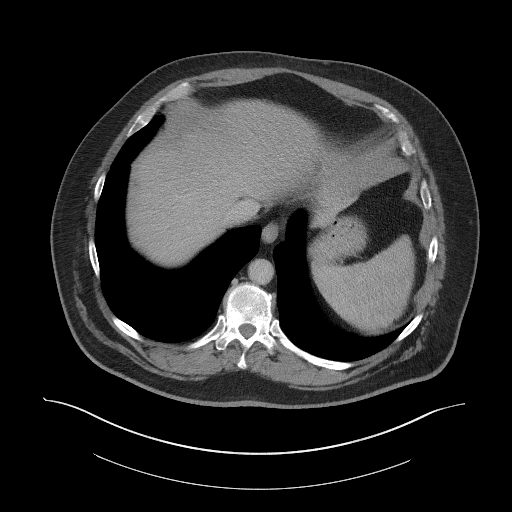

[12 of 46 positions shown; findings below may reference images not displayed]

FINDINGS: Lower chest: No acute findings.

Hepatobiliary: No mass visualized on this unenhanced exam.
Gallbladder is unremarkable. No evidence of biliary ductal
dilatation.

Pancreas: No mass or inflammatory process visualized on this
unenhanced exam.

Spleen:  Within normal limits in size.

Adrenals/Urinary tract: No adrenal masses identified. A few tiny
less than 5 mm renal calculi are seen bilaterally. No evidence of
ureteral calculi or hydronephrosis. A 3.5 cm simple Bosniak category
1 cyst is seen in the upper pole of the left kidney. No complex
cystic or solid renal masses identified. No masses seen involving
the collecting systems, ureters, or bladder.

Stomach/Bowel: No evidence of obstruction, inflammatory process, or
abnormal fluid collections. Normal appendix visualized.

Vascular/Lymphatic: No pathologically enlarged lymph nodes
identified. No evidence of abdominal aortic aneurysm. Aortic
atherosclerotic calcification noted.

Reproductive:  No mass or other significant abnormality.

Other:  None.

Musculoskeletal:  No suspicious bone lesions identified.
IMPRESSION: Tiny bilateral renal calculi. No evidence of ureteral calculi,
hydronephrosis, or other acute findings.

No radiographic evidence of urinary tract neoplasm.

## 2022-09-22 ENCOUNTER — Ambulatory Visit (INDEPENDENT_AMBULATORY_CARE_PROVIDER_SITE_OTHER): Payer: PPO | Admitting: Urology

## 2022-09-22 ENCOUNTER — Encounter: Payer: Self-pay | Admitting: Urology

## 2022-09-22 VITALS — BP 115/74 | HR 84

## 2022-09-22 DIAGNOSIS — N2889 Other specified disorders of kidney and ureter: Secondary | ICD-10-CM | POA: Diagnosis not present

## 2022-09-22 DIAGNOSIS — N200 Calculus of kidney: Secondary | ICD-10-CM

## 2022-09-22 DIAGNOSIS — N289 Disorder of kidney and ureter, unspecified: Secondary | ICD-10-CM

## 2022-09-22 NOTE — Patient Instructions (Signed)

## 2022-09-22 NOTE — Progress Notes (Unsigned)
09/22/2022 3:16 PM   [name] 1960-10-24 [id number]  Referring provider: Long Nation, MD [place],  [place] [address]  No chief complaint on file.   HPI:    PMH: Past Medical History:  Diagnosis Date   Chronic back pain    DDD (degenerative disc disease), lumbar    Depression    Diabetes mellitus    Hyperlipidemia    Hypertension    Kidney stones    Schizophrenia ([place])    Sleep apnea     Surgical History: Past Surgical History:  Procedure Laterality Date   CARDIAC CATHETERIZATION  2005   normal coronary arteries   COLONOSCOPY      Home Medications:  Allergies as of 09/22/2022       Reactions   Codeine    Morphine And Related Nausea And Vomiting   Penicillins         Medication List        Accurate as of September 22, 2022  3:16 PM. If you have any questions, ask your nurse or doctor.          Accu-Chek Guide Me w/Device Kit 1 Piece by Does not apply route as directed.   albuterol 108 (90 Base) MCG/ACT inhaler Commonly known as: VENTOLIN HFA Inhale 2 puffs into the lungs every 6 (six) hours as needed for wheezing or shortness of breath.   allopurinol 100 MG tablet Commonly known as: ZYLOPRIM Take 100 mg by mouth daily.   Aptiom 400 MG Tabs Generic drug: Eslicarbazepine Acetate Take 1 tablet by mouth daily.   aspirin EC 81 MG tablet Take 81 mg by mouth every morning.   aspirin-acetaminophen-caffeine 250-250-65 MG tablet Commonly known as: EXCEDRIN MIGRAINE Take 2 tablets by mouth every 6 (six) hours as needed for headache.   atorvastatin 40 MG tablet Commonly known as: LIPITOR Take 40 mg by mouth daily.   escitalopram 20 MG tablet Commonly known as: LEXAPRO Take 20 mg by mouth daily.   esomeprazole 40 MG capsule Commonly known as: NEXIUM Take 40 mg by mouth every morning.   Farxiga 10 MG Tabs tablet Generic drug: dapagliflozin propanediol Take 10 mg by mouth daily.   gabapentin 300 MG capsule Commonly  known as: NEURONTIN Take 1 capsule by mouth at bedtime.   glipiZIDE 10 MG tablet Commonly known as: GLUCOTROL Take 10 mg by mouth daily.   glucose blood test strip Commonly known as: Accu-Chek Guide Use as instructed   hydrochlorothiazide 12.5 MG capsule Commonly known as: MICROZIDE Take 12.5 mg by mouth daily.   Linzess 145 MCG Caps capsule Generic drug: linaclotide Take 145 mcg by mouth daily.   lisinopril 20 MG tablet Commonly known as: ZESTRIL Take 20 mg by mouth daily.   metFORMIN 1000 MG tablet Commonly known as: GLUCOPHAGE Take 1,000 mg by mouth 2 (two) times daily with a meal.   nitrofurantoin (macrocrystal-monohydrate) 100 MG capsule Commonly known as: MACROBID Take 1 capsule (100 mg total) by mouth daily.   Rybelsus 14 MG Tabs Generic drug: Semaglutide Take 1 tablet by mouth every morning.   tamsulosin 0.4 MG Caps capsule Commonly known as: FLOMAX Take 0.4 mg by mouth daily.   Trelegy Ellipta 100-62.5-25 MCG/ACT Aepb Generic drug: Fluticasone-Umeclidin-Vilant Inhale 1 puff into the lungs daily.        Allergies:  Allergies  Allergen Reactions   Codeine    Morphine And Related Nausea And Vomiting   Penicillins     Family History: Family  History  Problem Relation Age of Onset   Heart attack Mother     Social History:  reports that he has quit smoking. His smoking use included cigarettes. He started smoking about 49 years ago. He smoked an average of 1 pack per day. He has quit using smokeless tobacco.  His smokeless tobacco use included chew. He reports that he does not drink alcohol and does not use drugs.  ROS: All other review of systems were reviewed and are negative except what is noted above in HPI  Physical Exam: BP 115/74   Pulse 84   Constitutional:  Alert and oriented, No acute distress. HEENT: [place] AT, moist mucus membranes.  Trachea midline, no masses. Cardiovascular: No clubbing, cyanosis, or edema. Respiratory: Normal  respiratory effort, no increased work of breathing. GI: Abdomen is soft, nontender, nondistended, no abdominal masses GU: No CVA tenderness.  Lymph: No cervical or inguinal lymphadenopathy. Skin: No rashes, bruises or suspicious lesions. Neurologic: Grossly intact, no focal deficits, moving all 4 extremities. Psychiatric: Normal mood and affect.  Laboratory Data: Lab Results  Component Value Date   WBC 8.5 08/01/2018   HGB 12.2 (L) 08/01/2018   HCT 42.2 08/01/2018   MCV 75.2 (L) 08/01/2018   PLT 418 (H) 08/01/2018    Lab Results  Component Value Date   CREATININE 1.20 07/06/2021    No results found for: "PSA"  No results found for: "TESTOSTERONE"  Lab Results  Component Value Date   HGBA1C 8 12/01/2018    Urinalysis    Component Value Date/Time   COLORURINE RED (A) 10/24/2015 2034   APPEARANCEUR Clear 12/24/2021 0921   LABSPEC 1.010 10/24/2015 2034   PHURINE 8.5 (H) 10/24/2015 2034   GLUCOSEU Negative 12/24/2021 0921   HGBUR LARGE (A) 10/24/2015 2034   BILIRUBINUR Negative 12/24/2021 [place] 10/24/2015 2034   PROTEINUR Negative 12/24/2021 0921   PROTEINUR 100 (A) 10/24/2015 2034   UROBILINOGEN 0.2 03/11/2013 1720   NITRITE Negative 12/24/2021 0921   NITRITE NEGATIVE 10/24/2015 2034   LEUKOCYTESUR Negative 12/24/2021 0921    Lab Results  Component Value Date   LABMICR Comment 12/24/2021   WBCUA None seen 07/07/2021   LABEPIT None seen 07/07/2021   MUCUS Present (A) 06/09/2021   BACTERIA None seen 07/07/2021    Pertinent Imaging: *** No results found for this or any previous visit.  No results found for this or any previous visit.  No results found for this or any previous visit.  No results found for this or any previous visit.  No results found for this or any previous visit.  No valid procedures specified. Results for orders placed during the hospital encounter of 07/06/21  CT HEMATURIA WORKUP  Narrative CLINICAL DATA:   Painless gross hematuria.  EXAM: CT ABDOMEN AND PELVIS WITHOUT AND WITH CONTRAST  TECHNIQUE: Multidetector CT imaging of the abdomen and pelvis was performed following the standard protocol before and following the bolus administration of intravenous contrast.  CONTRAST:  171m OMNIPAQUE IOHEXOL 300 MG/ML  SOLN  COMPARISON:  10/24/2015  FINDINGS: Lower chest: No acute findings.  Hepatobiliary: No mass visualized on this unenhanced exam. Gallbladder is unremarkable. No evidence of biliary ductal dilatation.  Pancreas: No mass or inflammatory process visualized on this unenhanced exam.  Spleen:  Within normal limits in size.  Adrenals/Urinary tract: No adrenal masses identified. A few tiny less than 5 mm renal calculi are seen bilaterally. No evidence of ureteral calculi or hydronephrosis. A 3.5 cm simple Bosniak  category 1 cyst is seen in the upper pole of the left kidney. No complex cystic or solid renal masses identified. No masses seen involving the collecting systems, ureters, or bladder.  Stomach/Bowel: No evidence of obstruction, inflammatory process, or abnormal fluid collections. Normal appendix visualized.  Vascular/Lymphatic: No pathologically enlarged lymph nodes identified. No evidence of abdominal aortic aneurysm. Aortic atherosclerotic calcification noted.  Reproductive:  No mass or other significant abnormality.  Other:  None.  Musculoskeletal:  No suspicious bone lesions identified.  IMPRESSION: Tiny bilateral renal calculi. No evidence of ureteral calculi, hydronephrosis, or other acute findings.  No radiographic evidence of urinary tract neoplasm.   Electronically Signed By: Marlaine Hind M.D. On: 07/07/2021 08:28  Results for orders placed during the hospital encounter of 10/24/15  CT Renal Stone Study  Narrative CLINICAL DATA:  Low mid pelvic pain.  Hematuria.  EXAM: CT ABDOMEN AND PELVIS WITHOUT CONTRAST  TECHNIQUE: Multidetector  CT imaging of the abdomen and pelvis was performed following the standard protocol without IV contrast.  COMPARISON:  03/11/2013  FINDINGS: Lower chest: Left lower lobe lung nodule is unchanged and can be considered benign. Subpleural right middle low pulmonary nodule was likely excluded from the prior exam and measures 5 mm on image 1.  Normal heart size without pericardial or pleural effusion.  Hepatobiliary: Nonspecific caudate lobe enlargement. No focal liver lesion. Normal gallbladder, without biliary ductal dilatation.  Pancreas: Normal, without mass or ductal dilatation.  Spleen: Normal in size, without focal abnormality.  Adrenals/Urinary Tract: Normal adrenal glands. Bilateral, right larger than left renal collecting system calculi. No hydronephrosis. No bladder calculi. No hydroureter or ureteric calculi.  Stomach/Bowel: Normal stomach, without wall thickening. Colonic stool burden suggests constipation. Normal terminal ileum and appendix. Normal small bowel.  Vascular/Lymphatic: Aortic and branch vessel atherosclerosis. No abdominopelvic adenopathy.  Reproductive: Edema is identified about the seminal vesicles and prostate, including on images 78-80/series 2.  Other: No significant free fluid.  Musculoskeletal: Degenerative disc disease at multiple levels.  IMPRESSION: 1. Bilateral nephrolithiasis.  No obstructive uropathy. 2. Mild edema centered about the seminal vesicles and prostate. Suspicious for prostatitis. Cystitis felt less likely. 3. 5 mm subpleural right middle lobe pulmonary nodule. No follow-up needed if patient is low-risk. Non-contrast chest CT can be considered in 12 months if patient is high-risk. This recommendation follows the consensus statement: Guidelines for Management of Incidental Pulmonary Nodules Detected on CT Images:From the Fleischner Society 2017; published online before print (10.1148/radiol.SG:5268862).   Electronically  Signed By: Abigail Miyamoto M.D. On: 10/24/2015 23:41   Assessment & Plan:    1. Kidney lesion -renal US - Urinalysis, Routine w reflex microscopic  2. Nephrolithiasis -renal US   No follow-ups on file.  Nicolette Bang, MD  [facility] [place]

## 2022-09-23 LAB — URINALYSIS, ROUTINE W REFLEX MICROSCOPIC
Bilirubin, UA: NEGATIVE
Ketones, UA: NEGATIVE
Leukocytes,UA: NEGATIVE
Nitrite, UA: NEGATIVE
Protein,UA: NEGATIVE
Specific Gravity, UA: 1.015 (ref 1.005–1.030)
Urobilinogen, Ur: 0.2 mg/dL (ref 0.2–1.0)
pH, UA: 5 (ref 5.0–7.5)

## 2022-09-23 LAB — MICROSCOPIC EXAMINATION: Bacteria, UA: NONE SEEN

## 2022-10-01 ENCOUNTER — Ambulatory Visit (HOSPITAL_COMMUNITY)
Admission: RE | Admit: 2022-10-01 | Discharge: 2022-10-01 | Disposition: A | Discharge status: Home / Self Care | Payer: PPO | Source: Ambulatory Visit | Attending: Urology | Admitting: Urology

## 2022-10-01 DIAGNOSIS — N289 Disorder of kidney and ureter, unspecified: Secondary | ICD-10-CM | POA: Diagnosis not present

## 2022-10-01 DIAGNOSIS — N200 Calculus of kidney: Secondary | ICD-10-CM | POA: Diagnosis present

## 2022-10-05 NOTE — Progress Notes (Signed)
Letter sent.

## 2022-10-20 ENCOUNTER — Ambulatory Visit (INDEPENDENT_AMBULATORY_CARE_PROVIDER_SITE_OTHER): Payer: PPO | Admitting: Urology

## 2022-10-20 VITALS — BP 112/69 | HR 77

## 2022-10-20 DIAGNOSIS — R351 Nocturia: Secondary | ICD-10-CM | POA: Diagnosis not present

## 2022-10-20 DIAGNOSIS — N200 Calculus of kidney: Secondary | ICD-10-CM | POA: Diagnosis not present

## 2022-10-20 DIAGNOSIS — N401 Enlarged prostate with lower urinary tract symptoms: Secondary | ICD-10-CM

## 2022-10-20 DIAGNOSIS — N138 Other obstructive and reflux uropathy: Secondary | ICD-10-CM

## 2022-10-20 DIAGNOSIS — N289 Disorder of kidney and ureter, unspecified: Secondary | ICD-10-CM

## 2022-10-20 MED ORDER — TAMSULOSIN HCL 0.4 MG PO CAPS: 0.4000 mg | ORAL_CAPSULE | Freq: Every day | ORAL | 3 refills | Status: DC

## 2022-10-20 NOTE — Patient Instructions (Signed)

## 2022-10-20 NOTE — Progress Notes (Unsigned)
10/20/2022 2:50 PM   Xavier Williams 1961/02/01 [id number]  Referring provider: [place] [facility], MD [place],  [place] [address]  Followup renal cysts and BPH   HPI: Xavier Williams is a 62yo here for followup for renal cysts and BPH.Renal US from 10/01/2022 shows benign renal cysts. He denies any flank pain.  IPSS 16 QOL 3 on flomax 0.4mg  daily. Nocturia 2x. No straining to urinate. Urinary frequency every 3-4 hours. Urine stream fair.   PMH: Past Medical History:  Diagnosis Date   Chronic back pain    DDD (degenerative disc disease), lumbar    Depression    Diabetes mellitus    Hyperlipidemia    Hypertension    Kidney stones    Schizophrenia ([place])    Sleep apnea     Surgical History: Past Surgical History:  Procedure Laterality Date   CARDIAC CATHETERIZATION  2005   normal coronary arteries   COLONOSCOPY      Home Medications:  Allergies as of 10/20/2022       Reactions   Codeine    Morphine And Related Nausea And Vomiting   Penicillins         Medication List        Accurate as of October 20, 2022  2:50 PM. If you have any questions, ask your nurse or doctor.          STOP taking these medications    nitrofurantoin (macrocrystal-monohydrate) 100 MG capsule Commonly known as: MACROBID       TAKE these medications    Accu-Chek Guide Me w/Device Kit 1 Piece by Does not apply route as directed.   albuterol 108 (90 Base) MCG/ACT inhaler Commonly known as: VENTOLIN HFA Inhale 2 puffs into the lungs every 6 (six) hours as needed for wheezing or shortness of breath.   allopurinol 100 MG tablet Commonly known as: ZYLOPRIM Take 100 mg by mouth daily.   Aptiom 400 MG Tabs Generic drug: Eslicarbazepine Acetate Take 1 tablet by mouth daily.   aspirin EC 81 MG tablet Take 81 mg by mouth every morning.   aspirin-acetaminophen-caffeine 250-250-65 MG tablet Commonly known as: EXCEDRIN MIGRAINE Take 2 tablets by mouth every 6 (six) hours as  needed for headache.   atorvastatin 40 MG tablet Commonly known as: LIPITOR Take 40 mg by mouth daily.   escitalopram 20 MG tablet Commonly known as: LEXAPRO Take 20 mg by mouth daily.   esomeprazole 40 MG capsule Commonly known as: NEXIUM Take 40 mg by mouth every morning.   Farxiga 10 MG Tabs tablet Generic drug: dapagliflozin propanediol Take 10 mg by mouth daily.   gabapentin 300 MG capsule Commonly known as: NEURONTIN Take 1 capsule by mouth at bedtime.   glipiZIDE 10 MG tablet Commonly known as: GLUCOTROL Take 10 mg by mouth daily.   glucose blood test strip Commonly known as: Accu-Chek Guide Use as instructed   hydrochlorothiazide 12.5 MG capsule Commonly known as: MICROZIDE Take 12.5 mg by mouth daily.   Linzess 145 MCG Caps capsule Generic drug: linaclotide Take 145 mcg by mouth daily.   lisinopril 20 MG tablet Commonly known as: ZESTRIL Take 20 mg by mouth daily.   metFORMIN 1000 MG tablet Commonly known as: GLUCOPHAGE Take 1,000 mg by mouth 2 (two) times daily with a meal.   Rybelsus 14 MG Tabs Generic drug: Semaglutide Take 1 tablet by mouth every morning.   tamsulosin 0.4 MG Caps capsule Commonly known as: FLOMAX Take 0.4 mg  by mouth daily.   Trelegy Ellipta 100-62.5-25 MCG/ACT Aepb Generic drug: Fluticasone-Umeclidin-Vilant Inhale 1 puff into the lungs daily.        Allergies:  Allergies  Allergen Reactions   Codeine    Morphine And Related Nausea And Vomiting   Penicillins     Family History: Family History  Problem Relation Age of Onset   Heart attack Mother     Social History:  reports that he has quit smoking. His smoking use included cigarettes. He started smoking about 49 years ago. He smoked an average of 1 pack per day. He has quit using smokeless tobacco.  His smokeless tobacco use included chew. He reports that he does not drink alcohol and does not use drugs.  ROS: All other review of systems were reviewed and are  negative except what is noted above in HPI  Physical Exam: BP 112/69   Pulse 77   Constitutional:  Alert and oriented, No acute distress. HEENT:  AT, moist mucus membranes.  Trachea midline, no masses. Cardiovascular: No clubbing, cyanosis, or edema. Respiratory: Normal respiratory effort, no increased work of breathing. GI: Abdomen is soft, nontender, nondistended, no abdominal masses GU: No CVA tenderness.  Lymph: No cervical or inguinal lymphadenopathy. Skin: No rashes, bruises or suspicious lesions. Neurologic: Grossly intact, no focal deficits, moving all 4 extremities. Psychiatric: Normal mood and affect.  Laboratory Data: Lab Results  Component Value Date   WBC 8.5 08/01/2018   HGB 12.2 (L) 08/01/2018   HCT 42.2 08/01/2018   MCV 75.2 (L) 08/01/2018   PLT 418 (H) 08/01/2018    Lab Results  Component Value Date   CREATININE 1.20 07/06/2021    No results found for: "PSA"  No results found for: "TESTOSTERONE"  Lab Results  Component Value Date   HGBA1C 8 12/01/2018    Urinalysis    Component Value Date/Time   COLORURINE RED (A) 10/24/2015 2034   APPEARANCEUR Clear 09/22/2022 1551   LABSPEC 1.010 10/24/2015 2034   PHURINE 8.5 (H) 10/24/2015 2034   GLUCOSEU 3+ (A) 09/22/2022 1551   HGBUR LARGE (A) 10/24/2015 2034   BILIRUBINUR Negative 09/22/2022 [place] 10/24/2015 2034   PROTEINUR Negative 09/22/2022 1551   PROTEINUR 100 (A) 10/24/2015 2034   UROBILINOGEN 0.2 03/11/2013 1720   NITRITE Negative 09/22/2022 1551   NITRITE NEGATIVE 10/24/2015 2034   LEUKOCYTESUR Negative 09/22/2022 1551    Lab Results  Component Value Date   LABMICR See below: 09/22/2022   WBCUA 0-5 09/22/2022   LABEPIT 0-10 09/22/2022   MUCUS Present (A) 06/09/2021   BACTERIA None seen 09/22/2022    Pertinent Imaging: Renal US 10/01/2022: Images reviewed and discussed with the patient  No results found for this or any previous visit.  No results found for this  or any previous visit.  No results found for this or any previous visit.  No results found for this or any previous visit.  Results for orders placed in visit on 09/22/22  Ultrasound renal complete  Narrative CLINICAL DATA:  Cysts.  Nephrolithiasis.  EXAM: RENAL / URINARY TRACT ULTRASOUND COMPLETE  COMPARISON:  CT of the abdomen and pelvis July 06, 2021  FINDINGS: Right Kidney:  Renal measurements: 10.3 x 5.7 x 5.3 cm = volume: 166 mL. Echogenicity within normal limits. No mass or hydronephrosis visualized.  Left Kidney:  Renal measurements: 12.5 x 6.8 x 4.8 cm = volume: 212 mL. Contains a 2.6 cm cyst at the upper pole. No follow-up imaging recommended for  the cyst.  Bladder:  Appears normal for degree of bladder distention.  Other:  None.  IMPRESSION: No significant abnormalities.   Electronically Signed By: Dorise Bullion III M.D. On: 10/01/2022 15:58  No valid procedures specified. Results for orders placed during the hospital encounter of 07/06/21  CT HEMATURIA WORKUP  Narrative CLINICAL DATA:  Painless gross hematuria.  EXAM: CT ABDOMEN AND PELVIS WITHOUT AND WITH CONTRAST  TECHNIQUE: Multidetector CT imaging of the abdomen and pelvis was performed following the standard protocol before and following the bolus administration of intravenous contrast.  CONTRAST:  163mL OMNIPAQUE IOHEXOL 300 MG/ML  SOLN  COMPARISON:  10/24/2015  FINDINGS: Lower chest: No acute findings.  Hepatobiliary: No mass visualized on this unenhanced exam. Gallbladder is unremarkable. No evidence of biliary ductal dilatation.  Pancreas: No mass or inflammatory process visualized on this unenhanced exam.  Spleen:  Within normal limits in size.  Adrenals/Urinary tract: No adrenal masses identified. A few tiny less than 5 mm renal calculi are seen bilaterally. No evidence of ureteral calculi or hydronephrosis. A 3.5 cm simple Bosniak category 1 cyst is seen  in the upper pole of the left kidney. No complex cystic or solid renal masses identified. No masses seen involving the collecting systems, ureters, or bladder.  Stomach/Bowel: No evidence of obstruction, inflammatory process, or abnormal fluid collections. Normal appendix visualized.  Vascular/Lymphatic: No pathologically enlarged lymph nodes identified. No evidence of abdominal aortic aneurysm. Aortic atherosclerotic calcification noted.  Reproductive:  No mass or other significant abnormality.  Other:  None.  Musculoskeletal:  No suspicious bone lesions identified.  IMPRESSION: Tiny bilateral renal calculi. No evidence of ureteral calculi, hydronephrosis, or other acute findings.  No radiographic evidence of urinary tract neoplasm.   Electronically Signed By: Marlaine Hind M.D. On: 07/07/2021 08:28  Results for orders placed during the hospital encounter of 10/24/15  CT Renal Stone Study  Narrative CLINICAL DATA:  Low mid pelvic pain.  Hematuria.  EXAM: CT ABDOMEN AND PELVIS WITHOUT CONTRAST  TECHNIQUE: Multidetector CT imaging of the abdomen and pelvis was performed following the standard protocol without IV contrast.  COMPARISON:  03/11/2013  FINDINGS: Lower chest: Left lower lobe lung nodule is unchanged and can be considered benign. Subpleural right middle low pulmonary nodule was likely excluded from the prior exam and measures 5 mm on image 1.  Normal heart size without pericardial or pleural effusion.  Hepatobiliary: Nonspecific caudate lobe enlargement. No focal liver lesion. Normal gallbladder, without biliary ductal dilatation.  Pancreas: Normal, without mass or ductal dilatation.  Spleen: Normal in size, without focal abnormality.  Adrenals/Urinary Tract: Normal adrenal glands. Bilateral, right larger than left renal collecting system calculi. No hydronephrosis. No bladder calculi. No hydroureter or ureteric calculi.  Stomach/Bowel: Normal  stomach, without wall thickening. Colonic stool burden suggests constipation. Normal terminal ileum and appendix. Normal small bowel.  Vascular/Lymphatic: Aortic and branch vessel atherosclerosis. No abdominopelvic adenopathy.  Reproductive: Edema is identified about the seminal vesicles and prostate, including on images 78-80/series 2.  Other: No significant free fluid.  Musculoskeletal: Degenerative disc disease at multiple levels.  IMPRESSION: 1. Bilateral nephrolithiasis.  No obstructive uropathy. 2. Mild edema centered about the seminal vesicles and prostate. Suspicious for prostatitis. Cystitis felt less likely. 3. 5 mm subpleural right middle lobe pulmonary nodule. No follow-up needed if patient is low-risk. Non-contrast chest CT can be considered in 12 months if patient is high-risk. This recommendation follows the consensus statement: Guidelines for Management of Incidental Pulmonary Nodules Detected on CT  Images:From the Fleischner Society 2017; published online before print (10.1148/radiol.[id number]).   Electronically Signed By: Abigail Miyamoto M.D. On: 10/24/2015 23:41   Assessment & Plan:    1. Nephrolithiasis -followup 1 year with renal US - Urinalysis, Routine w reflex microscopic  2. Kidney lesion -US shows benign lesions - Urinalysis, Routine w reflex microscopic  3. BPH with obstruction/lower urinary tract symptoms -continue flomax 0.4mg  daily  4. Nocturia Continue flomax 0.4mg  daily   No follow-ups on file.  Nicolette Bang, MD  [facility] [place]

## 2022-10-21 ENCOUNTER — Encounter: Payer: Self-pay | Admitting: Urology

## 2022-10-21 LAB — URINALYSIS, ROUTINE W REFLEX MICROSCOPIC
Bilirubin, UA: NEGATIVE
Ketones, UA: NEGATIVE
Leukocytes,UA: NEGATIVE
Nitrite, UA: NEGATIVE
Protein,UA: NEGATIVE
RBC, UA: NEGATIVE
Specific Gravity, UA: 1.01 (ref 1.005–1.030)
Urobilinogen, Ur: 1 mg/dL (ref 0.2–1.0)
pH, UA: 5.5 (ref 5.0–7.5)

## 2023-04-18 ENCOUNTER — Ambulatory Visit: Payer: PPO | Admitting: Urology

## 2023-08-26 ENCOUNTER — Other Ambulatory Visit: Payer: Self-pay

## 2023-08-26 ENCOUNTER — Ambulatory Visit (INDEPENDENT_AMBULATORY_CARE_PROVIDER_SITE_OTHER): Payer: PPO | Admitting: Urology

## 2023-08-26 DIAGNOSIS — N200 Calculus of kidney: Secondary | ICD-10-CM

## 2023-08-29 ENCOUNTER — Ambulatory Visit (HOSPITAL_COMMUNITY)
Admission: RE | Admit: 2023-08-29 | Discharge: 2023-08-29 | Disposition: A | Discharge status: Home / Self Care | Payer: PPO | Source: Ambulatory Visit | Attending: Urology | Admitting: Urology

## 2023-08-29 DIAGNOSIS — N200 Calculus of kidney: Secondary | ICD-10-CM | POA: Diagnosis present

## 2023-08-30 NOTE — Progress Notes (Signed)
 rescheduled

## 2023-08-31 ENCOUNTER — Ambulatory Visit: Payer: PPO | Admitting: Urology

## 2023-08-31 VITALS — BP 115/70 | HR 86

## 2023-08-31 DIAGNOSIS — N200 Calculus of kidney: Secondary | ICD-10-CM

## 2023-08-31 LAB — URINALYSIS, ROUTINE W REFLEX MICROSCOPIC
Bilirubin, UA: NEGATIVE
Ketones, UA: NEGATIVE
Leukocytes,UA: NEGATIVE
Nitrite, UA: NEGATIVE
Protein,UA: NEGATIVE
RBC, UA: NEGATIVE
Specific Gravity, UA: 1.01 (ref 1.005–1.030)
Urobilinogen, Ur: 4 mg/dL — ABNORMAL HIGH (ref 0.2–1.0)
pH, UA: 7 (ref 5.0–7.5)

## 2023-08-31 MED ORDER — ONDANSETRON HCL 4 MG PO TABS: 4.0000 mg | ORAL_TABLET | Freq: Three times a day (TID) | ORAL | 0 refills | Status: DC | PRN

## 2023-08-31 MED ORDER — TAMSULOSIN HCL 0.4 MG PO CAPS: 0.4000 mg | ORAL_CAPSULE | Freq: Two times a day (BID) | ORAL | 0 refills | Status: DC

## 2023-08-31 MED ORDER — OXYCODONE-ACETAMINOPHEN 5-325 MG PO TABS: 1.0000 | ORAL_TABLET | ORAL | 0 refills | Status: DC | PRN

## 2023-08-31 NOTE — Progress Notes (Signed)
 08/31/2023 1:40 PM   Xavier Williams 04-23-1961 [id number]  Referring provider: Trudy Vaughn FALCON, MD [address] [place],  [place] [address]  nephrolithiasis   HPI: Xavier Williams is a 62yo here for followup for nephrolithiasis. For 2-3 weeks he has had intermittent left flank pain. Renal US  does not show his left lower pole calculus. KUB shows 5mm left proximal ureteral calculus. He denies nay worsening LUTS.    PMH: Past Medical History:  Diagnosis Date   Chronic back pain    DDD (degenerative disc disease), lumbar    Depression    Diabetes mellitus    Hyperlipidemia    Hypertension    Kidney stones    Schizophrenia (HCC)    Sleep apnea     Surgical History: Past Surgical History:  Procedure Laterality Date   CARDIAC CATHETERIZATION  2005   normal coronary arteries   COLONOSCOPY      Home Medications:  Allergies as of 08/31/2023       Reactions   Codeine    Morphine And Codeine Nausea And Vomiting   Penicillins         Medication List        Accurate as of August 31, 2023  1:40 PM. If you have any questions, ask your nurse or doctor.          Accu-Chek Guide Me w/Device Kit 1 Piece by Does not apply route as directed.   albuterol  108 (90 Base) MCG/ACT inhaler Commonly known as: VENTOLIN  HFA Inhale 2 puffs into the lungs every 6 (six) hours as needed for wheezing or shortness of breath.   allopurinol 100 MG tablet Commonly known as: ZYLOPRIM Take 100 mg by mouth daily.   Aptiom 400 MG Tabs Generic drug: Eslicarbazepine Acetate Take 1 tablet by mouth daily.   aspirin  EC 81 MG tablet Take 81 mg by mouth every morning.   aspirin -acetaminophen -caffeine 250-250-65 MG tablet Commonly known as: EXCEDRIN MIGRAINE Take 2 tablets by mouth every 6 (six) hours as needed for headache.   atorvastatin 40 MG tablet Commonly known as: LIPITOR Take 40 mg by mouth daily.   escitalopram 20 MG tablet Commonly known as: LEXAPRO Take 20 mg by mouth daily.    esomeprazole 40 MG capsule Commonly known as: NEXIUM Take 40 mg by mouth every morning.   Farxiga 10 MG Tabs tablet Generic drug: dapagliflozin propanediol Take 10 mg by mouth daily.   gabapentin 300 MG capsule Commonly known as: NEURONTIN Take 1 capsule by mouth at bedtime.   glipiZIDE 10 MG tablet Commonly known as: GLUCOTROL Take 10 mg by mouth daily.   glucose blood test strip Commonly known as: Accu-Chek Guide Use as instructed   hydrochlorothiazide 12.5 MG capsule Commonly known as: MICROZIDE Take 12.5 mg by mouth daily.   Linzess 145 MCG Caps capsule Generic drug: linaclotide Take 145 mcg by mouth daily.   lisinopril 20 MG tablet Commonly known as: ZESTRIL Take 20 mg by mouth daily.   metFORMIN 1000 MG tablet Commonly known as: GLUCOPHAGE Take 1,000 mg by mouth 2 (two) times daily with a meal.   Rybelsus 14 MG Tabs Generic drug: Semaglutide Take 1 tablet by mouth every morning.   tamsulosin  0.4 MG Caps capsule Commonly known as: FLOMAX  Take 1 capsule (0.4 mg total) by mouth daily.   Trelegy Ellipta  100-62.5-25 MCG/ACT Aepb Generic drug: Fluticasone-Umeclidin-Vilant Inhale 1 puff into the lungs daily.        Allergies:  Allergies  Allergen Reactions  Codeine    Morphine And Codeine Nausea And Vomiting   Penicillins     Family History: Family History  Problem Relation Age of Onset   Heart attack Mother     Social History:  reports that he has quit smoking. His smoking use included cigarettes. He started smoking about 50 years ago. He has a 50.1 pack-year smoking history. He has quit using smokeless tobacco.  His smokeless tobacco use included chew. He reports that he does not drink alcohol  and does not use drugs.  ROS: All other review of systems were reviewed and are negative except what is noted above in HPI  Physical Exam: BP 115/70   Pulse 86   Constitutional:  Alert and oriented, No acute distress. HEENT: Man AT, moist mucus  membranes.  Trachea midline, no masses. Cardiovascular: No clubbing, cyanosis, or edema. Respiratory: Normal respiratory effort, no increased work of breathing. GI: Abdomen is soft, nontender, nondistended, no abdominal masses GU: No CVA tenderness.  Lymph: No cervical or inguinal lymphadenopathy. Skin: No rashes, bruises or suspicious lesions. Neurologic: Grossly intact, no focal deficits, moving all 4 extremities. Psychiatric: Normal mood and affect.  Laboratory Data: Lab Results  Component Value Date   WBC 8.5 08/01/2018   HGB 12.2 (L) 08/01/2018   HCT 42.2 08/01/2018   MCV 75.2 (L) 08/01/2018   PLT 418 (H) 08/01/2018    Lab Results  Component Value Date   CREATININE 1.20 07/06/2021    No results found for: PSA  No results found for: TESTOSTERONE  Lab Results  Component Value Date   HGBA1C 8 12/01/2018    Urinalysis    Component Value Date/Time   COLORURINE RED (A) 10/24/2015 2034   APPEARANCEUR Clear 10/20/2022 1435   LABSPEC 1.010 10/24/2015 2034   PHURINE 8.5 (H) 10/24/2015 2034   GLUCOSEU 3+ (A) 10/20/2022 1435   HGBUR LARGE (A) 10/24/2015 2034   BILIRUBINUR Negative 10/20/2022 1435   KETONESUR NEGATIVE 10/24/2015 2034   PROTEINUR Negative 10/20/2022 1435   PROTEINUR 100 (A) 10/24/2015 2034   UROBILINOGEN 0.2 03/11/2013 1720   NITRITE Negative 10/20/2022 1435   NITRITE NEGATIVE 10/24/2015 2034   LEUKOCYTESUR Negative 10/20/2022 1435    Lab Results  Component Value Date   LABMICR Comment 10/20/2022   WBCUA 0-5 09/22/2022   LABEPIT 0-10 09/22/2022   MUCUS Present (A) 06/09/2021   BACTERIA None seen 09/22/2022    Pertinent Imaging: KUb today: Images reviewed and discussed with the patient  No results found for this or any previous visit.  No results found for this or any previous visit.  No results found for this or any previous visit.  No results found for this or any previous visit.  Results for orders placed in visit on  08/26/23  Ultrasound renal complete  Narrative : PROCEDURE: US  RENAL  HISTORY: Patient is a 63 y/o M with nephrolithiasis. Left renal cyst.  COMPARISON: U/S renal 10/01/2022, CT AP 07/06/2021.  TECHNIQUE: Two-dimensional grayscale and color Doppler ultrasound of the kidneys was performed.  FINDINGS: The urinary bladder demonstrates normal anechoic echogenicity. The bilateral ureteral jets are not visualized. Bladder volume is 151 mL. Postvoid bladder volume is 0 mL. No postvoid residual bladder volume.  The right kidney measures 10.5 x 5.7 x 3.9 cm. Renal cortical echotexture is normal. There is no hydronephrosis. There are no stones. There are no cysts.  The left kidney measures 12.5 x 6.5 x 5.7 cm. Renal cortical echotexture is normal. There is no hydronephrosis. There are no  stones. There is a simple cyst measuring 3.3 cm at the superior pole.  IMPRESSION: 1. Stable left renal cyst. No sonographic evidence of renal stones on today's ultrasound.  2.  No postvoid residual bladder volume.  Thank you for allowing us  to assist in the care of this patient.   Electronically Signed By: Lynwood Mains M.D. On: 08/29/2023 20:14  No results found for this or any previous visit.  Results for orders placed during the hospital encounter of 07/06/21  CT HEMATURIA WORKUP  Narrative CLINICAL DATA:  Painless gross hematuria.  EXAM: CT ABDOMEN AND PELVIS WITHOUT AND WITH CONTRAST  TECHNIQUE: Multidetector CT imaging of the abdomen and pelvis was performed following the standard protocol before and following the bolus administration of intravenous contrast.  CONTRAST:  OMNIPAQUE  IOHEXOL  300 MG/ML  SOLN  COMPARISON:  10/24/2015  FINDINGS: Lower chest: No acute findings.  Hepatobiliary: No mass visualized on this unenhanced exam. Gallbladder is unremarkable. No evidence of biliary ductal dilatation.  Pancreas: No mass or inflammatory process visualized on  this unenhanced exam.  Spleen:  Within normal limits in size.  Adrenals/Urinary tract: No adrenal masses identified. A few tiny less than 5 mm renal calculi are seen bilaterally. No evidence of ureteral calculi or hydronephrosis. A 3.5 cm simple Bosniak category 1 cyst is seen in the upper pole of the left kidney. No complex cystic or solid renal masses identified. No masses seen involving the collecting systems, ureters, or bladder.  Stomach/Bowel: No evidence of obstruction, inflammatory process, or abnormal fluid collections. Normal appendix visualized.  Vascular/Lymphatic: No pathologically enlarged lymph nodes identified. No evidence of abdominal aortic aneurysm. Aortic atherosclerotic calcification noted.  Reproductive:  No mass or other significant abnormality.  Other:  None.  Musculoskeletal:  No suspicious bone lesions identified.  IMPRESSION: Tiny bilateral renal calculi. No evidence of ureteral calculi, hydronephrosis, or other acute findings.  No radiographic evidence of urinary tract neoplasm.   Electronically Signed By: Norleen DELENA Kil M.D. On: 07/07/2021 08:28  Results for orders placed during the hospital encounter of 10/24/15  CT Renal Stone Study  Narrative CLINICAL DATA:  Low mid pelvic pain.  Hematuria.  EXAM: CT ABDOMEN AND PELVIS WITHOUT CONTRAST  TECHNIQUE: Multidetector CT imaging of the abdomen and pelvis was performed following the standard protocol without IV contrast.  COMPARISON:  03/11/2013  FINDINGS: Lower chest: Left lower lobe lung nodule is unchanged and can be considered benign. Subpleural right middle low pulmonary nodule was likely excluded from the prior exam and measures 5 mm on image 1.  Normal heart size without pericardial or pleural effusion.  Hepatobiliary: Nonspecific caudate lobe enlargement. No focal liver lesion. Normal gallbladder, without biliary ductal dilatation.  Pancreas: Normal, without mass or ductal  dilatation.  Spleen: Normal in size, without focal abnormality.  Adrenals/Urinary Tract: Normal adrenal glands. Bilateral, right larger than left renal collecting system calculi. No hydronephrosis. No bladder calculi. No hydroureter or ureteric calculi.  Stomach/Bowel: Normal stomach, without wall thickening. Colonic stool burden suggests constipation. Normal terminal ileum and appendix. Normal small bowel.  Vascular/Lymphatic: Aortic and branch vessel atherosclerosis. No abdominopelvic adenopathy.  Reproductive: Edema is identified about the seminal vesicles and prostate, including on images 78-80/series 2.  Other: No significant free fluid.  Musculoskeletal: Degenerative disc disease at multiple levels.  IMPRESSION: 1. Bilateral nephrolithiasis.  No obstructive uropathy. 2. Mild edema centered about the seminal vesicles and prostate. Suspicious for prostatitis. Cystitis felt less likely. 3. 5 mm subpleural right middle lobe pulmonary nodule. No  follow-up needed if patient is low-risk. Non-contrast chest CT can be considered in 12 months if patient is high-risk. This recommendation follows the consensus statement: Guidelines for Management of Incidental Pulmonary Nodules Detected on CT Images:From the Fleischner Society 2017; published online before print (10.1148/radiol.[id number]).   Electronically Signed By: Rockey Kilts M.D. On: 10/24/2015 23:41   Assessment & Plan:    1. Nephrolithiasis (Primary) -We discussed the management of kidney stones. These options include observation, ureteroscopy, shockwave lithotripsy (ESWL) and percutaneous nephrolithotomy (PCNL). We discussed which options are relevant to the patient's stone(s). We discussed the natural history of kidney stones as well as the complications of untreated stones and the impact on quality of life without treatment as well as with each of the above listed treatments. We also discussed the efficacy of each  treatment in its ability to clear the stone burden. With any of these management options I discussed the signs and symptoms of infection and the need for emergent treatment should these be experienced. For each option we discussed the ability of each procedure to clear the patient of their stone burden.   For observation I described the risks which include but are not limited to silent renal damage, life-threatening infection, need for emergent surgery, failure to pass stone and pain.   For ureteroscopy I described the risks which include bleeding, infection, damage to contiguous structures, positioning injury, ureteral stricture, ureteral avulsion, ureteral injury, need for prolonged ureteral stent, inability to perform ureteroscopy, need for an interval procedure, inability to clear stone burden, stent discomfort/pain, heart attack, stroke, pulmonary embolus and the inherent risks with general anesthesia.   For shockwave lithotripsy I described the risks which include arrhythmia, kidney contusion, kidney hemorrhage, need for transfusion, pain, inability to adequately break up stone, inability to pass stone fragments, Steinstrasse, infection associated with obstructing stones, need for alternate surgical procedure, need for repeat shockwave lithotripsy, MI, CVA, PE and the inherent risks with anesthesia/conscious sedation.   For PCNL I described the risks including positioning injury, pneumothorax, hydrothorax, need for chest tube, inability to clear stone burden, renal laceration, arterial venous fistula or malformation, need for embolization of kidney, loss of kidney or renal function, need for repeat procedure, need for prolonged nephrostomy tube, ureteral avulsion, MI, CVA, PE and the inherent risks of general anesthesia.   - The patient would like to proceed with medical expulsive therapy.  - Urinalysis, Routine w reflex microscopic   No follow-ups on file.  Belvie Clara, MD  [facility]  [facility] [place]

## 2023-09-06 ENCOUNTER — Encounter: Payer: Self-pay | Admitting: Urology

## 2023-09-06 NOTE — Patient Instructions (Signed)

## 2023-09-14 ENCOUNTER — Ambulatory Visit: Payer: PPO | Admitting: Urology

## 2023-09-20 ENCOUNTER — Ambulatory Visit (HOSPITAL_COMMUNITY)
Admission: RE | Admit: 2023-09-20 | Discharge: 2023-09-20 | Disposition: A | Discharge status: Home / Self Care | Payer: PPO | Source: Ambulatory Visit | Attending: Urology | Admitting: Urology

## 2023-09-20 DIAGNOSIS — N200 Calculus of kidney: Secondary | ICD-10-CM | POA: Diagnosis present

## 2023-09-21 ENCOUNTER — Ambulatory Visit: Payer: PPO | Admitting: Urology

## 2023-09-21 VITALS — BP 119/74 | HR 72

## 2023-09-21 DIAGNOSIS — N201 Calculus of ureter: Secondary | ICD-10-CM

## 2023-09-21 DIAGNOSIS — N200 Calculus of kidney: Secondary | ICD-10-CM

## 2023-09-21 NOTE — H&P (View-Only) (Signed)
 09/21/2023 3:39 PM   Xavier Williams Apr 23, 1961 [id number]  Referring provider: Donetta Potts, MD [address] [place],  [place] [address]  nephrolithiasis   HPI: [name] is a 62yo here for followup for nephrolithiasis. He has not passed his left ureteral calculus. KUB from today shows left UPJ calculus. He denies any flank pain.   PMH: Past Medical History:  Diagnosis Date   Chronic back pain    DDD (degenerative disc disease), lumbar    Depression    Diabetes mellitus    Hyperlipidemia    Hypertension    Kidney stones    Schizophrenia (HCC)    Sleep apnea     Surgical History: Past Surgical History:  Procedure Laterality Date   CARDIAC CATHETERIZATION  2005   normal coronary arteries   COLONOSCOPY      Home Medications:  Allergies as of 09/21/2023       Reactions   Codeine    Morphine And Codeine Nausea And Vomiting   Penicillins         Medication List        Accurate as of September 21, 2023  3:39 PM. If you have any questions, ask your nurse or doctor.          STOP taking these medications    allopurinol 100 MG tablet Commonly known as: ZYLOPRIM   Aptiom 400 MG Tabs Generic drug: Eslicarbazepine Acetate   aspirin-acetaminophen-caffeine 250-250-65 MG tablet Commonly known as: EXCEDRIN MIGRAINE   glucose blood test strip Commonly known as: Accu-Chek Guide   lisinopril 20 MG tablet Commonly known as: ZESTRIL   metFORMIN 1000 MG tablet Commonly known as: GLUCOPHAGE   ondansetron 4 MG tablet Commonly known as: Zofran   oxyCODONE-acetaminophen 5-325 MG tablet Commonly known as: Percocet       TAKE these medications    Accu-Chek Guide Me w/Device Kit 1 Piece by Does not apply route as directed.   albuterol 108 (90 Base) MCG/ACT inhaler Commonly known as: VENTOLIN HFA Inhale 2 puffs into the lungs every 6 (six) hours as needed for wheezing or shortness of breath.   aspirin EC 81 MG tablet Take 81 mg by mouth every  morning.   atorvastatin 40 MG tablet Commonly known as: LIPITOR Take 40 mg by mouth daily.   escitalopram 20 MG tablet Commonly known as: LEXAPRO Take 20 mg by mouth daily.   esomeprazole 40 MG capsule Commonly known as: NEXIUM Take 40 mg by mouth every morning.   Farxiga 10 MG Tabs tablet Generic drug: dapagliflozin propanediol Take 10 mg by mouth daily.   gabapentin 300 MG capsule Commonly known as: NEURONTIN Take 1 capsule by mouth at bedtime.   glipiZIDE 10 MG tablet Commonly known as: GLUCOTROL Take 10 mg by mouth daily.   hydrochlorothiazide 12.5 MG capsule Commonly known as: MICROZIDE Take 12.5 mg by mouth daily.   Linzess 145 MCG Caps capsule Generic drug: linaclotide Take 145 mcg by mouth daily.   Rybelsus 14 MG Tabs Generic drug: Semaglutide Take 1 tablet by mouth every morning.   tamsulosin 0.4 MG Caps capsule Commonly known as: FLOMAX Take 1 capsule (0.4 mg total) by mouth in the morning and at bedtime.   Trelegy Ellipta 100-62.5-25 MCG/ACT Aepb Generic drug: Fluticasone-Umeclidin-Vilant Inhale 1 puff into the lungs daily.        Allergies:  Allergies  Allergen Reactions   Codeine    Morphine And Codeine Nausea And Vomiting   Penicillins  Family History: Family History  Problem Relation Age of Onset   Heart attack Mother     Social History:  reports that he has quit smoking. His smoking use included cigarettes. He started smoking about 50 years ago. He has a 50.2 pack-year smoking history. He has quit using smokeless tobacco.  His smokeless tobacco use included chew. He reports that he does not drink alcohol and does not use drugs.  ROS: All other review of systems were reviewed and are negative except what is noted above in HPI  Physical Exam: BP 119/74   Pulse 72   Constitutional:  Alert and oriented, No acute distress. HEENT: [place] AT, moist mucus membranes.  Trachea midline, no masses. Cardiovascular: No clubbing, cyanosis, or  edema. Respiratory: Normal respiratory effort, no increased work of breathing. GI: Abdomen is soft, nontender, nondistended, no abdominal masses GU: No CVA tenderness.  Lymph: No cervical or inguinal lymphadenopathy. Skin: No rashes, bruises or suspicious lesions. Neurologic: Grossly intact, no focal deficits, moving all 4 extremities. Psychiatric: Normal mood and affect.  Laboratory Data: Lab Results  Component Value Date   WBC 8.5 08/01/2018   HGB 12.2 (L) 08/01/2018   HCT 42.2 08/01/2018   MCV 75.2 (L) 08/01/2018   PLT 418 (H) 08/01/2018    Lab Results  Component Value Date   CREATININE 1.20 07/06/2021    No results found for: "PSA"  No results found for: "TESTOSTERONE"  Lab Results  Component Value Date   HGBA1C 8 12/01/2018    Urinalysis    Component Value Date/Time   COLORURINE RED (A) 10/24/2015 2034   APPEARANCEUR Clear 08/31/2023 1321   LABSPEC 1.010 10/24/2015 2034   PHURINE 8.5 (H) 10/24/2015 2034   GLUCOSEU 3+ (A) 08/31/2023 1321   HGBUR LARGE (A) 10/24/2015 2034   BILIRUBINUR Negative 08/31/2023 1321   KETONESUR NEGATIVE 10/24/2015 2034   PROTEINUR Negative 08/31/2023 1321   PROTEINUR 100 (A) 10/24/2015 2034   UROBILINOGEN 0.2 03/11/2013 1720   NITRITE Negative 08/31/2023 1321   NITRITE NEGATIVE 10/24/2015 2034   LEUKOCYTESUR Negative 08/31/2023 1321    Lab Results  Component Value Date   LABMICR Comment 08/31/2023   WBCUA 0-5 09/22/2022   LABEPIT 0-10 09/22/2022   MUCUS Present (A) 06/09/2021   BACTERIA None seen 09/22/2022    Pertinent Imaging: KUB today: Images reviewed and discussed with patient  Results for orders placed in visit on 08/26/23  DG Abd 1 View  Narrative CLINICAL DATA:  Kidney stone.  Left flank pain.  EXAM: ABDOMEN - 1 VIEW  COMPARISON:  Same-day renal ultrasound., CT 07/06/2021  FINDINGS: Potential 10 mm stone projecting over the lower pole of the left kidney. Calcifications to the left of L1-L2 appears  outside the renal shadow. The punctate right renal stones on prior CT have no definite radiographic correlate. Moderate volume of colonic stool, stool partially obscures assessment of the renal beds. No obstruction.  IMPRESSION: 1. Potential 10 mm stone projecting over the lower pole of the left kidney. 2. Calcifications to the left of L1-L2 appears outside the renal shadow, favored debris extrarenal, possible phlebolith.   Electronically Signed By: Narda Rutherford M.D. On: 09/10/2023 10:56  No results found for this or any previous visit.  No results found for this or any previous visit.  No results found for this or any previous visit.  Results for orders placed in visit on 08/26/23  Ultrasound renal complete  Narrative : PROCEDURE: US RENAL  HISTORY: Patient is a 63 y/o  M with nephrolithiasis. Left renal cyst.  COMPARISON: U/S renal 10/01/2022, CT AP 07/06/2021.  TECHNIQUE: Two-dimensional grayscale and color Doppler ultrasound of the kidneys was performed.  FINDINGS: The urinary bladder demonstrates normal anechoic echogenicity. The bilateral ureteral jets are not visualized. Bladder volume is 151 mL. Postvoid bladder volume is 0 mL. No postvoid residual bladder volume.  The right kidney measures 10.5 x 5.7 x 3.9 cm. Renal cortical echotexture is normal. There is no hydronephrosis. There are no stones. There are no cysts.  The left kidney measures 12.5 x 6.5 x 5.7 cm. Renal cortical echotexture is normal. There is no hydronephrosis. There are no stones. There is a simple cyst measuring 3.3 cm at the superior pole.  IMPRESSION: 1. Stable left renal cyst. No sonographic evidence of renal stones on today's ultrasound.  2.  No postvoid residual bladder volume.  Thank you for allowing [place] to assist in the care of this patient.   Electronically Signed By: Lestine Box M.D. On: 08/29/2023 20:14  No results found for this or any previous  visit.  Results for orders placed during the hospital encounter of 07/06/21  CT HEMATURIA WORKUP  Narrative CLINICAL DATA:  Painless gross hematuria.  EXAM: CT ABDOMEN AND PELVIS WITHOUT AND WITH CONTRAST  TECHNIQUE: Multidetector CT imaging of the abdomen and pelvis was performed following the standard protocol before and following the bolus administration of intravenous contrast.  CONTRAST:  OMNIPAQUE IOHEXOL 300 MG/ML  SOLN  COMPARISON:  10/24/2015  FINDINGS: Lower chest: No acute findings.  Hepatobiliary: No mass visualized on this unenhanced exam. Gallbladder is unremarkable. No evidence of biliary ductal dilatation.  Pancreas: No mass or inflammatory process visualized on this unenhanced exam.  Spleen:  Within normal limits in size.  Adrenals/Urinary tract: No adrenal masses identified. A few tiny less than 5 mm renal calculi are seen bilaterally. No evidence of ureteral calculi or hydronephrosis. A 3.5 cm simple Bosniak category 1 cyst is seen in the upper pole of the left kidney. No complex cystic or solid renal masses identified. No masses seen involving the collecting systems, ureters, or bladder.  Stomach/Bowel: No evidence of obstruction, inflammatory process, or abnormal fluid collections. Normal appendix visualized.  Vascular/Lymphatic: No pathologically enlarged lymph nodes identified. No evidence of abdominal aortic aneurysm. Aortic atherosclerotic calcification noted.  Reproductive:  No mass or other significant abnormality.  Other:  None.  Musculoskeletal:  No suspicious bone lesions identified.  IMPRESSION: Tiny bilateral renal calculi. No evidence of ureteral calculi, hydronephrosis, or other acute findings.  No radiographic evidence of urinary tract neoplasm.   Electronically Signed By: Danae Orleans M.D. On: 07/07/2021 08:28  Results for orders placed during the hospital encounter of 10/24/15  CT Renal Stone  Study  Narrative CLINICAL DATA:  Low mid pelvic pain.  Hematuria.  EXAM: CT ABDOMEN AND PELVIS WITHOUT CONTRAST  TECHNIQUE: Multidetector CT imaging of the abdomen and pelvis was performed following the standard protocol without IV contrast.  COMPARISON:  03/11/2013  FINDINGS: Lower chest: Left lower lobe lung nodule is unchanged and can be considered benign. Subpleural right middle low pulmonary nodule was likely excluded from the prior exam and measures 5 mm on image 1.  Normal heart size without pericardial or pleural effusion.  Hepatobiliary: Nonspecific caudate lobe enlargement. No focal liver lesion. Normal gallbladder, without biliary ductal dilatation.  Pancreas: Normal, without mass or ductal dilatation.  Spleen: Normal in size, without focal abnormality.  Adrenals/Urinary Tract: Normal adrenal glands. Bilateral, right larger than left  renal collecting system calculi. No hydronephrosis. No bladder calculi. No hydroureter or ureteric calculi.  Stomach/Bowel: Normal stomach, without wall thickening. Colonic stool burden suggests constipation. Normal terminal ileum and appendix. Normal small bowel.  Vascular/Lymphatic: Aortic and branch vessel atherosclerosis. No abdominopelvic adenopathy.  Reproductive: Edema is identified about the seminal vesicles and prostate, including on images 78-80/series 2.  Other: No significant free fluid.  Musculoskeletal: Degenerative disc disease at multiple levels.  IMPRESSION: 1. Bilateral nephrolithiasis.  No obstructive uropathy. 2. Mild edema centered about the seminal vesicles and prostate. Suspicious for prostatitis. Cystitis felt less likely. 3. 5 mm subpleural right middle lobe pulmonary nodule. No follow-up needed if patient is low-risk. Non-contrast chest CT can be considered in 12 months if patient is high-risk. This recommendation follows the consensus statement: Guidelines for Management of Incidental Pulmonary  Nodules Detected on CT Images:From the Fleischner Society 2017; published online before print (10.1148/radiol.[id number]).   Electronically Signed By: Jeronimo Greaves M.D. On: 10/24/2015 23:41   Assessment & Plan:    1. Nephrolithiasis (Primary) -We discussed the management of kidney stones. These options include observation, ureteroscopy, shockwave lithotripsy (ESWL) and percutaneous nephrolithotomy (PCNL). We discussed which options are relevant to the patient's stone(s). We discussed the natural history of kidney stones as well as the complications of untreated stones and the impact on quality of life without treatment as well as with each of the above listed treatments. We also discussed the efficacy of each treatment in its ability to clear the stone burden. With any of these management options I discussed the signs and symptoms of infection and the need for emergent treatment should these be experienced. For each option we discussed the ability of each procedure to clear the patient of their stone burden.   For observation I described the risks which include but are not limited to silent renal damage, life-threatening infection, need for emergent surgery, failure to pass stone and pain.   For ureteroscopy I described the risks which include bleeding, infection, damage to contiguous structures, positioning injury, ureteral stricture, ureteral avulsion, ureteral injury, need for prolonged ureteral stent, inability to perform ureteroscopy, need for an interval procedure, inability to clear stone burden, stent discomfort/pain, heart attack, stroke, pulmonary embolus and the inherent risks with general anesthesia.   For shockwave lithotripsy I described the risks which include arrhythmia, kidney contusion, kidney hemorrhage, need for transfusion, pain, inability to adequately break up stone, inability to pass stone fragments, Steinstrasse, infection associated with obstructing stones, need for  alternate surgical procedure, need for repeat shockwave lithotripsy, MI, CVA, PE and the inherent risks with anesthesia/conscious sedation.   For PCNL I described the risks including positioning injury, pneumothorax, hydrothorax, need for chest tube, inability to clear stone burden, renal laceration, arterial venous fistula or malformation, need for embolization of kidney, loss of kidney or renal function, need for repeat procedure, need for prolonged nephrostomy tube, ureteral avulsion, MI, CVA, PE and the inherent risks of general anesthesia.   - The patient would like to proceed with left ESWL - Ambulatory Referral For Surgery Scheduling   No follow-ups on file.  Wilkie Aye, MD  [facility] [place]

## 2023-09-21 NOTE — Progress Notes (Signed)
 09/21/2023 3:39 PM   Xavier Williams Apr 23, 1961 [id number]  Referring provider: Donetta Potts, MD [address] [place],  [place] [address]  nephrolithiasis   HPI: Xavier Williams is a 62yo here for followup for nephrolithiasis. He has not passed his left ureteral calculus. KUB from today shows left UPJ calculus. He denies any flank pain.   PMH: Past Medical History:  Diagnosis Date   Chronic back pain    DDD (degenerative disc disease), lumbar    Depression    Diabetes mellitus    Hyperlipidemia    Hypertension    Kidney stones    Schizophrenia (HCC)    Sleep apnea     Surgical History: Past Surgical History:  Procedure Laterality Date   CARDIAC CATHETERIZATION  2005   normal coronary arteries   COLONOSCOPY      Home Medications:  Allergies as of 09/21/2023       Reactions   Codeine    Morphine And Codeine Nausea And Vomiting   Penicillins         Medication List        Accurate as of September 21, 2023  3:39 PM. If you have any questions, ask your nurse or doctor.          STOP taking these medications    allopurinol 100 MG tablet Commonly known as: ZYLOPRIM   Aptiom 400 MG Tabs Generic drug: Eslicarbazepine Acetate   aspirin-acetaminophen-caffeine 250-250-65 MG tablet Commonly known as: EXCEDRIN MIGRAINE   glucose blood test strip Commonly known as: Accu-Chek Guide   lisinopril 20 MG tablet Commonly known as: ZESTRIL   metFORMIN 1000 MG tablet Commonly known as: GLUCOPHAGE   ondansetron 4 MG tablet Commonly known as: Zofran   oxyCODONE-acetaminophen 5-325 MG tablet Commonly known as: Percocet       TAKE these medications    Accu-Chek Guide Me w/Device Kit 1 Piece by Does not apply route as directed.   albuterol 108 (90 Base) MCG/ACT inhaler Commonly known as: VENTOLIN HFA Inhale 2 puffs into the lungs every 6 (six) hours as needed for wheezing or shortness of breath.   aspirin EC 81 MG tablet Take 81 mg by mouth every  morning.   atorvastatin 40 MG tablet Commonly known as: LIPITOR Take 40 mg by mouth daily.   escitalopram 20 MG tablet Commonly known as: LEXAPRO Take 20 mg by mouth daily.   esomeprazole 40 MG capsule Commonly known as: NEXIUM Take 40 mg by mouth every morning.   Farxiga 10 MG Tabs tablet Generic drug: dapagliflozin propanediol Take 10 mg by mouth daily.   gabapentin 300 MG capsule Commonly known as: NEURONTIN Take 1 capsule by mouth at bedtime.   glipiZIDE 10 MG tablet Commonly known as: GLUCOTROL Take 10 mg by mouth daily.   hydrochlorothiazide 12.5 MG capsule Commonly known as: MICROZIDE Take 12.5 mg by mouth daily.   Linzess 145 MCG Caps capsule Generic drug: linaclotide Take 145 mcg by mouth daily.   Rybelsus 14 MG Tabs Generic drug: Semaglutide Take 1 tablet by mouth every morning.   tamsulosin 0.4 MG Caps capsule Commonly known as: FLOMAX Take 1 capsule (0.4 mg total) by mouth in the morning and at bedtime.   Trelegy Ellipta 100-62.5-25 MCG/ACT Aepb Generic drug: Fluticasone-Umeclidin-Vilant Inhale 1 puff into the lungs daily.        Allergies:  Allergies  Allergen Reactions   Codeine    Morphine And Codeine Nausea And Vomiting   Penicillins  Family History: Family History  Problem Relation Age of Onset   Heart attack Mother     Social History:  reports that he has quit smoking. His smoking use included cigarettes. He started smoking about 50 years ago. He has a 50.2 pack-year smoking history. He has quit using smokeless tobacco.  His smokeless tobacco use included chew. He reports that he does not drink alcohol and does not use drugs.  ROS: All other review of systems were reviewed and are negative except what is noted above in HPI  Physical Exam: BP 119/74   Pulse 72   Constitutional:  Alert and oriented, No acute distress. HEENT: [place] AT, moist mucus membranes.  Trachea midline, no masses. Cardiovascular: No clubbing, cyanosis, or  edema. Respiratory: Normal respiratory effort, no increased work of breathing. GI: Abdomen is soft, nontender, nondistended, no abdominal masses GU: No CVA tenderness.  Lymph: No cervical or inguinal lymphadenopathy. Skin: No rashes, bruises or suspicious lesions. Neurologic: Grossly intact, no focal deficits, moving all 4 extremities. Psychiatric: Normal mood and affect.  Laboratory Data: Lab Results  Component Value Date   WBC 8.5 08/01/2018   HGB 12.2 (L) 08/01/2018   HCT 42.2 08/01/2018   MCV 75.2 (L) 08/01/2018   PLT 418 (H) 08/01/2018    Lab Results  Component Value Date   CREATININE 1.20 07/06/2021    No results found for: "PSA"  No results found for: "TESTOSTERONE"  Lab Results  Component Value Date   HGBA1C 8 12/01/2018    Urinalysis    Component Value Date/Time   COLORURINE RED (A) 10/24/2015 2034   APPEARANCEUR Clear 08/31/2023 1321   LABSPEC 1.010 10/24/2015 2034   PHURINE 8.5 (H) 10/24/2015 2034   GLUCOSEU 3+ (A) 08/31/2023 1321   HGBUR LARGE (A) 10/24/2015 2034   BILIRUBINUR Negative 08/31/2023 1321   KETONESUR NEGATIVE 10/24/2015 2034   PROTEINUR Negative 08/31/2023 1321   PROTEINUR 100 (A) 10/24/2015 2034   UROBILINOGEN 0.2 03/11/2013 1720   NITRITE Negative 08/31/2023 1321   NITRITE NEGATIVE 10/24/2015 2034   LEUKOCYTESUR Negative 08/31/2023 1321    Lab Results  Component Value Date   LABMICR Comment 08/31/2023   WBCUA 0-5 09/22/2022   LABEPIT 0-10 09/22/2022   MUCUS Present (A) 06/09/2021   BACTERIA None seen 09/22/2022    Pertinent Imaging: KUB today: Images reviewed and discussed with patient  Results for orders placed in visit on 08/26/23  DG Abd 1 View  Narrative CLINICAL DATA:  Kidney stone.  Left flank pain.  EXAM: ABDOMEN - 1 VIEW  COMPARISON:  Same-day renal ultrasound., CT 07/06/2021  FINDINGS: Potential 10 mm stone projecting over the lower pole of the left kidney. Calcifications to the left of L1-L2 appears  outside the renal shadow. The punctate right renal stones on prior CT have no definite radiographic correlate. Moderate volume of colonic stool, stool partially obscures assessment of the renal beds. No obstruction.  IMPRESSION: 1. Potential 10 mm stone projecting over the lower pole of the left kidney. 2. Calcifications to the left of L1-L2 appears outside the renal shadow, favored debris extrarenal, possible phlebolith.   Electronically Signed By: Narda Rutherford M.D. On: 09/10/2023 10:56  No results found for this or any previous visit.  No results found for this or any previous visit.  No results found for this or any previous visit.  Results for orders placed in visit on 08/26/23  Ultrasound renal complete  Narrative : PROCEDURE: US RENAL  HISTORY: Patient is a 63 y/o  M with nephrolithiasis. Left renal cyst.  COMPARISON: U/S renal 10/01/2022, CT [facility] 07/06/2021.  TECHNIQUE: Two-dimensional grayscale and color Doppler ultrasound of the kidneys was performed.  FINDINGS: The urinary bladder demonstrates normal anechoic echogenicity. The bilateral ureteral jets are not visualized. Bladder volume is 151 mL. Postvoid bladder volume is 0 mL. No postvoid residual bladder volume.  The right kidney measures 10.5 x 5.7 x 3.9 cm. Renal cortical echotexture is normal. There is no hydronephrosis. There are no stones. There are no cysts.  The left kidney measures 12.5 x 6.5 x 5.7 cm. Renal cortical echotexture is normal. There is no hydronephrosis. There are no stones. There is a simple cyst measuring 3.3 cm at the superior pole.  IMPRESSION: 1. Stable left renal cyst. No sonographic evidence of renal stones on today's ultrasound.  2.  No postvoid residual bladder volume.  Thank you for allowing [place] to assist in the care of this patient.   Electronically Signed By: Lestine Box M.D. On: 08/29/2023 20:14  No results found for this or any previous  visit.  Results for orders placed during the hospital encounter of 07/06/21  CT HEMATURIA WORKUP  Narrative CLINICAL DATA:  Painless gross hematuria.  EXAM: CT ABDOMEN AND PELVIS WITHOUT AND WITH CONTRAST  TECHNIQUE: Multidetector CT imaging of the abdomen and pelvis was performed following the standard protocol before and following the bolus administration of intravenous contrast.  CONTRAST:  OMNIPAQUE IOHEXOL 300 MG/ML  SOLN  COMPARISON:  10/24/2015  FINDINGS: Lower chest: No acute findings.  Hepatobiliary: No mass visualized on this unenhanced exam. Gallbladder is unremarkable. No evidence of biliary ductal dilatation.  Pancreas: No mass or inflammatory process visualized on this unenhanced exam.  Spleen:  Within normal limits in size.  Adrenals/Urinary tract: No adrenal masses identified. A few tiny less than 5 mm renal calculi are seen bilaterally. No evidence of ureteral calculi or hydronephrosis. A 3.5 cm simple Bosniak category 1 cyst is seen in the upper pole of the left kidney. No complex cystic or solid renal masses identified. No masses seen involving the collecting systems, ureters, or bladder.  Stomach/Bowel: No evidence of obstruction, inflammatory process, or abnormal fluid collections. Normal appendix visualized.  Vascular/Lymphatic: No pathologically enlarged lymph nodes identified. No evidence of abdominal aortic aneurysm. Aortic atherosclerotic calcification noted.  Reproductive:  No mass or other significant abnormality.  Other:  None.  Musculoskeletal:  No suspicious bone lesions identified.  IMPRESSION: Tiny bilateral renal calculi. No evidence of ureteral calculi, hydronephrosis, or other acute findings.  No radiographic evidence of urinary tract neoplasm.   Electronically Signed By: Danae Orleans M.D. On: 07/07/2021 08:28  Results for orders placed during the hospital encounter of 10/24/15  CT Renal Stone  Study  Narrative CLINICAL DATA:  Low mid pelvic pain.  Hematuria.  EXAM: CT ABDOMEN AND PELVIS WITHOUT CONTRAST  TECHNIQUE: Multidetector CT imaging of the abdomen and pelvis was performed following the standard protocol without IV contrast.  COMPARISON:  03/11/2013  FINDINGS: Lower chest: Left lower lobe lung nodule is unchanged and can be considered benign. Subpleural right middle low pulmonary nodule was likely excluded from the prior exam and measures 5 mm on image 1.  Normal heart size without pericardial or pleural effusion.  Hepatobiliary: Nonspecific caudate lobe enlargement. No focal liver lesion. Normal gallbladder, without biliary ductal dilatation.  Pancreas: Normal, without mass or ductal dilatation.  Spleen: Normal in size, without focal abnormality.  Adrenals/Urinary Tract: Normal adrenal glands. Bilateral, right larger than left  renal collecting system calculi. No hydronephrosis. No bladder calculi. No hydroureter or ureteric calculi.  Stomach/Bowel: Normal stomach, without wall thickening. Colonic stool burden suggests constipation. Normal terminal ileum and appendix. Normal small bowel.  Vascular/Lymphatic: Aortic and branch vessel atherosclerosis. No abdominopelvic adenopathy.  Reproductive: Edema is identified about the seminal vesicles and prostate, including on images 78-80/series 2.  Other: No significant free fluid.  Musculoskeletal: Degenerative disc disease at multiple levels.  IMPRESSION: 1. Bilateral nephrolithiasis.  No obstructive uropathy. 2. Mild edema centered about the seminal vesicles and prostate. Suspicious for prostatitis. Cystitis felt less likely. 3. 5 mm subpleural right middle lobe pulmonary nodule. No follow-up needed if patient is low-risk. Non-contrast chest CT can be considered in 12 months if patient is high-risk. This recommendation follows the consensus statement: Guidelines for Management of Incidental Pulmonary  Nodules Detected on CT Images:From the Fleischner Society 2017; published online before print (10.1148/radiol.[id number]).   Electronically Signed By: Jeronimo Greaves M.D. On: 10/24/2015 23:41   Assessment & Plan:    1. Nephrolithiasis (Primary) -We discussed the management of kidney stones. These options include observation, ureteroscopy, shockwave lithotripsy (ESWL) and percutaneous nephrolithotomy (PCNL). We discussed which options are relevant to the patient's stone(s). We discussed the natural history of kidney stones as well as the complications of untreated stones and the impact on quality of life without treatment as well as with each of the above listed treatments. We also discussed the efficacy of each treatment in its ability to clear the stone burden. With any of these management options I discussed the signs and symptoms of infection and the need for emergent treatment should these be experienced. For each option we discussed the ability of each procedure to clear the patient of their stone burden.   For observation I described the risks which include but are not limited to silent renal damage, life-threatening infection, need for emergent surgery, failure to pass stone and pain.   For ureteroscopy I described the risks which include bleeding, infection, damage to contiguous structures, positioning injury, ureteral stricture, ureteral avulsion, ureteral injury, need for prolonged ureteral stent, inability to perform ureteroscopy, need for an interval procedure, inability to clear stone burden, stent discomfort/pain, heart attack, stroke, pulmonary embolus and the inherent risks with general anesthesia.   For shockwave lithotripsy I described the risks which include arrhythmia, kidney contusion, kidney hemorrhage, need for transfusion, pain, inability to adequately break up stone, inability to pass stone fragments, Steinstrasse, infection associated with obstructing stones, need for  alternate surgical procedure, need for repeat shockwave lithotripsy, MI, CVA, PE and the inherent risks with anesthesia/conscious sedation.   For PCNL I described the risks including positioning injury, pneumothorax, hydrothorax, need for chest tube, inability to clear stone burden, renal laceration, arterial venous fistula or malformation, need for embolization of kidney, loss of kidney or renal function, need for repeat procedure, need for prolonged nephrostomy tube, ureteral avulsion, MI, CVA, PE and the inherent risks of general anesthesia.   - The patient would like to proceed with left ESWL - Ambulatory Referral For Surgery Scheduling   No follow-ups on file.  Wilkie Aye, MD  [facility] [place]

## 2023-09-26 ENCOUNTER — Encounter (HOSPITAL_COMMUNITY)
Admission: RE | Admit: 2023-09-26 | Discharge: 2023-09-26 | Disposition: A | Discharge status: Home / Self Care | Payer: PPO | Source: Ambulatory Visit | Attending: Urology | Admitting: Urology

## 2023-09-26 ENCOUNTER — Encounter (HOSPITAL_COMMUNITY): Payer: Self-pay

## 2023-09-26 ENCOUNTER — Other Ambulatory Visit: Payer: Self-pay

## 2023-09-26 NOTE — Progress Notes (Signed)
 Notified Dr.McKenzie that patient took his ASA on 09/24/23 and litho is on 09/27/23. Per Dr.Mckenzie via secure chat patient needs to be rescheduled. I then sent message to his office staff (see below) and called and notified patient.    Lucia Gaskins, RN  Ferdinand Lango, RN; Boykins, [place], LPN; Carloyn Jaeger H During patient's PAT he states he took baby Aspirin on 09/24/23 at 10:00 am. Dr.McKenzie notified and states this patient has to be rescheduled via secure chat. Patient was notified. Patient is aware his procedure for tomorrow is cancelled. I also explained to the patient that he must stop NSAIDs 2 days prior and ASA 3 days prior. Thanks, [employer]

## 2023-09-27 ENCOUNTER — Encounter: Payer: Self-pay | Admitting: Urology

## 2023-09-27 DIAGNOSIS — N200 Calculus of kidney: Secondary | ICD-10-CM

## 2023-09-27 NOTE — Patient Instructions (Signed)

## 2023-09-30 ENCOUNTER — Encounter (HOSPITAL_COMMUNITY)
Admission: RE | Admit: 2023-09-30 | Discharge: 2023-09-30 | Disposition: A | Discharge status: Home / Self Care | Source: Ambulatory Visit | Attending: Urology | Admitting: Urology

## 2023-09-30 ENCOUNTER — Encounter (HOSPITAL_COMMUNITY): Payer: Self-pay

## 2023-09-30 NOTE — Pre-Procedure Instructions (Signed)
 Pre-op phone call done. Patient states he does not have a blue folder. I asked him to contact office and also made office aware of this conversation.

## 2023-10-04 ENCOUNTER — Ambulatory Visit (HOSPITAL_COMMUNITY)
Admission: RE | Admit: 2023-10-04 | Discharge: 2023-10-04 | Disposition: A | Discharge status: Home / Self Care | Payer: PPO | Attending: Urology | Admitting: Urology

## 2023-10-04 ENCOUNTER — Encounter (HOSPITAL_COMMUNITY)
Admission: RE | Disposition: A | Discharge status: Home / Self Care | Payer: Self-pay | Source: Home / Self Care | Attending: Urology

## 2023-10-04 ENCOUNTER — Encounter (HOSPITAL_COMMUNITY): Payer: Self-pay | Admitting: Urology

## 2023-10-04 ENCOUNTER — Other Ambulatory Visit: Payer: Self-pay

## 2023-10-04 ENCOUNTER — Ambulatory Visit (HOSPITAL_COMMUNITY)

## 2023-10-04 DIAGNOSIS — E119 Type 2 diabetes mellitus without complications: Secondary | ICD-10-CM | POA: Diagnosis not present

## 2023-10-04 DIAGNOSIS — Z87891 Personal history of nicotine dependence: Secondary | ICD-10-CM | POA: Diagnosis not present

## 2023-10-04 DIAGNOSIS — Z7984 Long term (current) use of oral hypoglycemic drugs: Secondary | ICD-10-CM | POA: Insufficient documentation

## 2023-10-04 DIAGNOSIS — G4730 Sleep apnea, unspecified: Secondary | ICD-10-CM | POA: Insufficient documentation

## 2023-10-04 DIAGNOSIS — N200 Calculus of kidney: Secondary | ICD-10-CM | POA: Diagnosis present

## 2023-10-04 DIAGNOSIS — E669 Obesity, unspecified: Secondary | ICD-10-CM | POA: Diagnosis not present

## 2023-10-04 HISTORY — PX: EXTRACORPOREAL SHOCK WAVE LITHOTRIPSY: SHX1557

## 2023-10-04 LAB — GLUCOSE, CAPILLARY: Glucose-Capillary: 171 mg/dL — ABNORMAL HIGH (ref 70–99)

## 2023-10-04 SURGERY — LITHOTRIPSY, ESWL
Anesthesia: LOCAL | Laterality: Left

## 2023-10-04 MED ORDER — TAMSULOSIN HCL 0.4 MG PO CAPS: 0.4000 mg | ORAL_CAPSULE | Freq: Two times a day (BID) | ORAL | 0 refills | Status: DC

## 2023-10-04 MED ORDER — DIAZEPAM 5 MG PO TABS
10.0000 mg | ORAL_TABLET | Freq: Once | ORAL | Status: AC
  Administered 2023-10-04: 10 mg via ORAL
  Filled 2023-10-04: qty 2

## 2023-10-04 MED ORDER — SODIUM CHLORIDE 0.9 % IV SOLN: Freq: Once | INTRAVENOUS | Status: AC

## 2023-10-04 MED ORDER — DIPHENHYDRAMINE HCL 25 MG PO CAPS
25.0000 mg | ORAL_CAPSULE | ORAL | Status: AC
  Administered 2023-10-04: 25 mg via ORAL
  Filled 2023-10-04: qty 1

## 2023-10-04 MED ORDER — ONDANSETRON HCL 4 MG PO TABS: 4.0000 mg | ORAL_TABLET | Freq: Every day | ORAL | 1 refills | Status: AC | PRN

## 2023-10-04 MED ORDER — OXYCODONE-ACETAMINOPHEN 5-325 MG PO TABS: 1.0000 | ORAL_TABLET | ORAL | 0 refills | Status: AC | PRN

## 2023-10-04 NOTE — Progress Notes (Signed)
 Minimal bruising noted to left posterior flank from ESWL.

## 2023-10-04 NOTE — Interval H&P Note (Signed)
 History and Physical Interval Note:  10/04/2023 9:21 AM  Xavier Williams  has presented today for surgery, with the diagnosis of Left Nephrolithiasis.  The various methods of treatment have been discussed with the patient and family. After consideration of risks, benefits and other options for treatment, the patient has consented to  Procedure(s): LITHOTRIPSY, ESWL (Left) as a surgical intervention.  The patient's history has been reviewed, patient examined, no change in status, stable for surgery.  I have reviewed the patient's chart and labs.  Questions were answered to the patient's satisfaction.     Wilkie Aye

## 2023-10-05 ENCOUNTER — Encounter (HOSPITAL_COMMUNITY): Payer: Self-pay | Admitting: Urology

## 2023-10-19 ENCOUNTER — Telehealth: Payer: Self-pay

## 2023-10-19 ENCOUNTER — Other Ambulatory Visit: Payer: Self-pay | Admitting: Urology

## 2023-10-19 DIAGNOSIS — Z09 Encounter for follow-up examination after completed treatment for conditions other than malignant neoplasm: Secondary | ICD-10-CM

## 2023-10-19 DIAGNOSIS — N200 Calculus of kidney: Secondary | ICD-10-CM

## 2023-10-19 NOTE — Progress Notes (Unsigned)
 Name: Xavier Williams DOB: February 13, 1961 MRN: [id number]  Diagnoses: 1) Post-operative state  HPI: Xavier Williams presents post-operatively.  ***He is accompanied by ***.  He underwent left ESWL procedure on 10/04/2023 by Dr. Ronne Binning for management of a left UPJ stone.  Postop course: KUB today: Awaiting radiology read; ***  Today He reports ***  He {Actions; denies-reports:120008} flank pain or abdominal pain. He {Actions; denies-reports:120008} fevers, nausea, or vomiting.  He {Actions; denies-reports:120008} increased urinary urgency, frequency, nocturia, dysuria, gross hematuria, hesitancy, straining to void, or sensations of incomplete emptying.  Medications: Current Outpatient Medications  Medication Sig Dispense Refill   albuterol (PROVENTIL HFA;VENTOLIN HFA) 108 (90 Base) MCG/ACT inhaler Inhale 2 puffs into the lungs every 6 (six) hours as needed for wheezing or shortness of breath.     aspirin EC 81 MG tablet Take 81 mg by mouth every morning.     atorvastatin (LIPITOR) 40 MG tablet Take 40 mg by mouth daily.     Blood Glucose Monitoring Suppl (ACCU-CHEK GUIDE ME) w/Device KIT 1 Piece by Does not apply route as directed. 1 kit 0   escitalopram (LEXAPRO) 20 MG tablet Take 20 mg by mouth daily.     esomeprazole (NEXIUM) 40 MG capsule Take 40 mg by mouth every morning.      FARXIGA 10 MG TABS tablet Take 10 mg by mouth daily.     gabapentin (NEURONTIN) 300 MG capsule Take 1 capsule by mouth at bedtime.     glipiZIDE (GLUCOTROL) 10 MG tablet Take 10 mg by mouth daily.     hydrochlorothiazide (MICROZIDE) 12.5 MG capsule Take 12.5 mg by mouth daily.     LINZESS 145 MCG CAPS capsule Take 145 mcg by mouth daily.     ondansetron (ZOFRAN) 4 MG tablet Take 1 tablet (4 mg total) by mouth daily as needed for nausea or vomiting. 30 tablet 1   oxyCODONE-acetaminophen (PERCOCET) 5-325 MG tablet Take 1 tablet by mouth every 4 (four) hours as needed. 30 tablet 0   RYBELSUS 14 MG TABS Take 1  tablet by mouth every morning.     tamsulosin (FLOMAX) 0.4 MG CAPS capsule Take 1 capsule (0.4 mg total) by mouth in the morning and at bedtime. 30 capsule 0   TRELEGY ELLIPTA 100-62.5-25 MCG/ACT AEPB Inhale 1 puff into the lungs daily. 1 each 0   No current facility-administered medications for this visit.    Allergies: Allergies  Allergen Reactions   Codeine    Morphine And Codeine Nausea And Vomiting   Penicillins     Past Medical History:  Diagnosis Date   Chronic back pain    DDD (degenerative disc disease), lumbar    Depression    Diabetes mellitus    Hyperlipidemia    Hypertension    Kidney stones    Schizophrenia (HCC)    Sleep apnea    Past Surgical History:  Procedure Laterality Date   CARDIAC CATHETERIZATION  2005   normal coronary arteries   COLONOSCOPY     EXTRACORPOREAL SHOCK WAVE LITHOTRIPSY Left 10/04/2023   Procedure: LITHOTRIPSY, ESWL;  Surgeon: Malen Gauze, MD;  Location: [facility];  Service: Urology;  Laterality: Left;   Family History  Problem Relation Age of Onset   Heart attack Mother    Social History   Socioeconomic History   Marital status: Married    Spouse name: Not on file   Number of children: Not on file   Years of education: Not on file  Highest education level: Not on file  Occupational History   Not on file  Tobacco Use   Smoking status: Former    Current packs/day: 1.00    Average packs/day: 1 pack/day for 50.2 years (50.2 ttl pk-yrs)    Types: Cigarettes    Start date: 07/26/1973   Smokeless tobacco: Former    Types: Engineer, drilling   Vaping status: Never Used  Substance and Sexual Activity   Alcohol use: No   Drug use: No   Sexual activity: Not on file  Other Topics Concern   Not on file  Social History Narrative   Not on file   Social Drivers of Health   Financial Resource Strain: Not on file  Food Insecurity: Not on file  Transportation Needs: Not on file  Physical Activity: Not on file  Stress: Not  on file  Social Connections: Not on file  Intimate Partner Violence: Not on file    SUBJECTIVE  Review of Systems Constitutional: Patient denies any unintentional weight loss or change in strength lntegumentary: Patient denies any rashes or pruritus Cardiovascular: Patient denies chest pain or syncope Respiratory: Patient denies shortness of breath Gastrointestinal: ***Patient {Actions; denies-reports:120008} ***nausea, ***vomiting, ***constipation, ***diarrhea ***As per HPI Musculoskeletal: Patient denies muscle cramps or weakness Neurologic: Patient denies convulsions or seizures Allergic/Immunologic: Patient denies recent allergic reaction(s) Hematologic/Lymphatic: Patient denies bleeding tendencies Endocrine: Patient denies heat/cold intolerance  GU: As per HPI.  OBJECTIVE There were no vitals filed for this visit. There is no height or weight on file to calculate BMI.  Physical Examination Constitutional: No obvious distress; patient is non-toxic appearing  Cardiovascular: No visible lower extremity edema.  Respiratory: The patient does not have audible wheezing/stridor; respirations do not appear labored  Gastrointestinal: Abdomen non-distended Musculoskeletal: Normal ROM of UEs  Skin: No obvious rashes/open sores  Neurologic: CN 2-12 grossly intact Psychiatric: Answered questions appropriately with normal affect  Hematologic/Lymphatic/Immunologic: No obvious bruises or sites of spontaneous bleeding  UA: ***negative ***positive for *** leukocytes, *** blood, ***nitrites Urine microscopy: *** WBC/hpf, *** RBC/hpf, *** bacteria ***otherwise unremarkable ***glucosuria (secondary to ***Jardiance ***Farxiga use)  PVR: *** ml  ASSESSMENT Nephrolithiasis  We reviewed the operative procedures and findings. He is doing ***well. Pre-operative symptoms are *** since the procedure. We reviewed recent imaging results; ***awaiting radiology results, appears to have ***no  acute findings.  ***For stone prevention: Advised adequate hydration and we discussed option to consider low oxalate diet given that calcium oxalate is the most common type of stone. Handout provided about stone prevention diet.  ***For recurrent stone formers: We discussed option to proceed with 24 hour urinalysis ([facility]) for metabolic evaluation, which may help with targeted recommendations for dietary I medication therapies for stone prevention. Patient elected to ***proceed/ ***hold off.  Will plan to follow up in ***6 months / ***1 year with KUB ***RUS for stone surveillance or sooner if needed.  Patient verbalized understanding of and agreement with current plan. All questions were answered.  PLAN Advised the following: ***Maintain adequate fluid intake. ***Low oxalate diet. No follow-ups on file.  *** Billing: 90 day global period for ESWL ***  No orders of the defined types were placed in this encounter.   It has been explained that the patient is to follow regularly with their PCP in addition to all other providers involved in their care and to follow instructions provided by these respective offices. Patient advised to contact urology clinic if any urologic-pertaining questions, concerns, new symptoms or problems arise  in the interim period.  There are no Patient Instructions on file for this visit.  Electronically signed by:  Donnita Falls, MSN, FNP-C, CUNP 10/19/2023 3:16 PM

## 2023-10-19 NOTE — Telephone Encounter (Signed)
 Called Pt to relay message from NP see last telephone encounter left detailed vm

## 2023-10-19 NOTE — Telephone Encounter (Signed)
-----   Message from Xavier Williams sent at 10/19/2023  3:17 PM EDT ----- Please instruct patient to get KUB prior to postop visit tomorrow. Thanks.

## 2023-10-20 ENCOUNTER — Other Ambulatory Visit: Payer: Self-pay | Admitting: Urology

## 2023-10-20 ENCOUNTER — Ambulatory Visit (HOSPITAL_COMMUNITY)
Admission: RE | Admit: 2023-10-20 | Discharge: 2023-10-20 | Disposition: A | Discharge status: Home / Self Care | Source: Ambulatory Visit | Attending: Urology | Admitting: Urology

## 2023-10-20 ENCOUNTER — Ambulatory Visit (INDEPENDENT_AMBULATORY_CARE_PROVIDER_SITE_OTHER): Payer: PPO | Admitting: Urology

## 2023-10-20 VITALS — BP 118/77 | HR 85

## 2023-10-20 DIAGNOSIS — N200 Calculus of kidney: Secondary | ICD-10-CM

## 2023-10-20 DIAGNOSIS — Z87442 Personal history of urinary calculi: Secondary | ICD-10-CM

## 2023-10-20 DIAGNOSIS — Z09 Encounter for follow-up examination after completed treatment for conditions other than malignant neoplasm: Secondary | ICD-10-CM

## 2023-10-20 LAB — URINALYSIS, ROUTINE W REFLEX MICROSCOPIC
Bilirubin, UA: NEGATIVE
Ketones, UA: NEGATIVE
Leukocytes,UA: NEGATIVE
Nitrite, UA: NEGATIVE
Protein,UA: NEGATIVE
RBC, UA: NEGATIVE
Specific Gravity, UA: 1.015 (ref 1.005–1.030)
Urobilinogen, Ur: 4 mg/dL — ABNORMAL HIGH (ref 0.2–1.0)
pH, UA: 7 (ref 5.0–7.5)

## 2023-10-21 ENCOUNTER — Telehealth: Payer: Self-pay

## 2023-10-21 NOTE — Telephone Encounter (Signed)
 Patient is aware of NP's response and that someone will be reaching out to schedule imaging once authorized with insurance.

## 2023-10-21 NOTE — Telephone Encounter (Signed)
-----   Message from Donnita Falls sent at 10/20/2023  1:36 PM EDT ----- Please let pt know that Dr. Ronne Binning was consulted and reviewed everything; he advised CT stone for further evaluation. Order placed. Will determine recommended follow up / next steps based on CT results.

## 2023-10-24 ENCOUNTER — Telehealth: Payer: Self-pay

## 2023-10-24 MED ORDER — NITROFURANTOIN MONOHYD MACRO 100 MG PO CAPS: 100.0000 mg | ORAL_CAPSULE | Freq: Two times a day (BID) | ORAL | 0 refills | Status: AC

## 2023-10-24 NOTE — Telephone Encounter (Signed)
-----   Message from Wilkie Aye sent at 10/24/2023  1:12 PM EDT ----- Send him macrobid 100mg  BID for 7 days ----- Message ----- From: Gustavus Messing, LPN Sent: [phone]  10:11 AM EDT To: Malen Gauze, MD  Outside urine culture from 10/21/23. No treatment started.

## 2023-10-28 ENCOUNTER — Ambulatory Visit (HOSPITAL_COMMUNITY)
Admission: RE | Admit: 2023-10-28 | Discharge: 2023-10-28 | Disposition: A | Discharge status: Home / Self Care | Source: Ambulatory Visit | Attending: Urology | Admitting: Urology

## 2023-10-28 DIAGNOSIS — N200 Calculus of kidney: Secondary | ICD-10-CM | POA: Diagnosis present

## 2023-11-10 ENCOUNTER — Telehealth: Payer: Self-pay

## 2023-11-10 ENCOUNTER — Other Ambulatory Visit: Payer: Self-pay | Admitting: Urology

## 2023-11-10 DIAGNOSIS — N200 Calculus of kidney: Secondary | ICD-10-CM

## 2023-11-10 NOTE — Telephone Encounter (Signed)
-----   Message from Lauretta Ponto sent at 11/10/2023 12:08 PM EDT ----- Please let pt know that his CT on 10/28/2023 confirmed there is a non-obstructing left renal calculi measuring up to 8 mm. No hydronephrosis. Left renal cyst measuring 3.3 cm.   Advised to follow up in 6 months with [facility]. Orders placed.

## 2023-11-10 NOTE — Telephone Encounter (Signed)
 Left voicemail requesting a call back for results.

## 2023-11-11 ENCOUNTER — Telehealth: Payer: Self-pay

## 2023-11-11 NOTE — Telephone Encounter (Signed)
-----   Message from Lauretta Ponto sent at 11/10/2023 12:08 PM EDT ----- Please let pt know that his CT on 10/28/2023 confirmed there is a non-obstructing left renal calculi measuring up to 8 mm. No hydronephrosis. Left renal cyst measuring 3.3 cm.   Advised to follow up in 6 months with [facility]. Orders placed.

## 2023-11-11 NOTE — Telephone Encounter (Signed)
 Called pt to relay results from NP see previous encounter for details left detailed vm

## 2023-12-14 ENCOUNTER — Other Ambulatory Visit: Payer: Self-pay | Admitting: Urology

## 2024-04-25 ENCOUNTER — Ambulatory Visit (HOSPITAL_COMMUNITY): Attending: Urology

## 2024-05-03 ENCOUNTER — Ambulatory Visit (HOSPITAL_COMMUNITY): Attending: Urology

## 2024-05-11 ENCOUNTER — Ambulatory Visit (HOSPITAL_COMMUNITY)
Admission: RE | Admit: 2024-05-11 | Discharge: 2024-05-11 | Disposition: A | Discharge status: Home / Self Care | Source: Ambulatory Visit | Attending: Urology | Admitting: Urology

## 2024-05-11 DIAGNOSIS — N200 Calculus of kidney: Secondary | ICD-10-CM | POA: Diagnosis present

## 2024-05-14 ENCOUNTER — Ambulatory Visit: Admitting: Urology

## 2024-05-16 ENCOUNTER — Ambulatory Visit: Admitting: Urology

## 2024-05-16 VITALS — BP 104/67 | HR 98

## 2024-05-16 DIAGNOSIS — N200 Calculus of kidney: Secondary | ICD-10-CM

## 2024-05-16 LAB — URINALYSIS, ROUTINE W REFLEX MICROSCOPIC
Bilirubin, UA: NEGATIVE
Ketones, UA: NEGATIVE
Leukocytes,UA: NEGATIVE
Nitrite, UA: NEGATIVE
Protein,UA: NEGATIVE
RBC, UA: NEGATIVE
Specific Gravity, UA: 1.02 (ref 1.005–1.030)
Urobilinogen, Ur: 1 mg/dL (ref 0.2–1.0)
pH, UA: 5.5 (ref 5.0–7.5)

## 2024-05-16 NOTE — Progress Notes (Signed)
 05/16/2024 10:48 AM   Xavier Williams [birth year] [id number]  Referring provider: Jerome Heron Ruth, PA-C [address] [place],  [place] [address]  Followup nephrolithiasis   HPI: Xavier Williams is a 63yo here for followup for nephrolithiasis. Renal US  10/17 shows a 6mm left renal calculus. He denies any flank pain. NO worsening LUTS. No hematuria. No other complaints today   PMH: Past Medical History:  Diagnosis Date   Chronic back pain    DDD (degenerative disc disease), lumbar    Depression    Diabetes mellitus    Hyperlipidemia    Hypertension    Kidney stones    Schizophrenia [facility])    Sleep apnea     Surgical History: Past Surgical History:  Procedure Laterality Date   CARDIAC CATHETERIZATION  2005   normal coronary arteries   COLONOSCOPY     EXTRACORPOREAL SHOCK WAVE LITHOTRIPSY Left 10/04/2023   Procedure: LITHOTRIPSY, ESWL;  Surgeon: Sherrilee Belvie LITTIE, MD;  Location: [facility];  Service: Urology;  Laterality: Left;    Home Medications:  Allergies as of 05/16/2024       Reactions   Codeine    Morphine And Codeine Nausea And Vomiting   Penicillins         Medication List        Accurate as of May 16, 2024 10:48 AM. If you have any questions, ask your nurse or doctor.          Accu-Chek Guide Me w/Device Kit 1 Piece by Does not apply route as directed.   albuterol  108 (90 Base) MCG/ACT inhaler Commonly known as: VENTOLIN  HFA Inhale 2 puffs into the lungs every 6 (six) hours as needed for wheezing or shortness of breath.   aspirin  EC 81 MG tablet Take 81 mg by mouth every morning.   atorvastatin 40 MG tablet Commonly known as: LIPITOR Take 40 mg by mouth daily.   escitalopram 20 MG tablet Commonly known as: LEXAPRO Take 20 mg by mouth daily.   esomeprazole 40 MG capsule Commonly known as: NEXIUM Take 40 mg by mouth every morning.   Farxiga 10 MG Tabs tablet Generic drug: dapagliflozin propanediol Take 10 mg by mouth daily.    gabapentin 300 MG capsule Commonly known as: NEURONTIN Take 1 capsule by mouth at bedtime.   glipiZIDE 10 MG tablet Commonly known as: GLUCOTROL Take 10 mg by mouth daily.   hydrochlorothiazide 12.5 MG capsule Commonly known as: MICROZIDE Take 12.5 mg by mouth daily.   Linzess 145 MCG Caps capsule Generic drug: linaclotide Take 145 mcg by mouth daily.   nitrofurantoin  (macrocrystal-monohydrate) 100 MG capsule Commonly known as: MACROBID  Take 1 capsule (100 mg total) by mouth every 12 (twelve) hours.   ondansetron  4 MG tablet Commonly known as: Zofran  Take 1 tablet (4 mg total) by mouth daily as needed for nausea or vomiting.   oxyCODONE -acetaminophen  5-325 MG tablet Commonly known as: Percocet Take 1 tablet by mouth every 4 (four) hours as needed.   Rybelsus 14 MG Tabs Generic drug: Semaglutide Take 1 tablet by mouth every morning.   tamsulosin  0.4 MG Caps capsule Commonly known as: FLOMAX  TAKE 1 CAPSULE BY MOUTH EVERY DAY   Trelegy Ellipta  100-62.5-25 MCG/ACT Aepb Generic drug: Fluticasone-Umeclidin-Vilant Inhale 1 puff into the lungs daily.        Allergies:  Allergies  Allergen Reactions   Codeine    Morphine And Codeine Nausea And Vomiting   Penicillins     Family History: Family History  Problem Relation Age of Onset   Heart attack Mother     Social History:  reports that he has quit smoking. His smoking use included cigarettes. He started smoking about 50 years ago. He has a 50.8 pack-year smoking history. He has quit using smokeless tobacco.  His smokeless tobacco use included chew. He reports that he does not drink alcohol  and does not use drugs.  ROS: All other review of systems were reviewed and are negative except what is noted above in HPI  Physical Exam: BP 104/67   Pulse 98   Constitutional:  Alert and oriented, No acute distress. HEENT: [place] AT, moist mucus membranes.  Trachea midline, no masses. Cardiovascular: No clubbing, cyanosis,  or edema. Respiratory: Normal respiratory effort, no increased work of breathing. GI: Abdomen is soft, nontender, nondistended, no abdominal masses GU: No CVA tenderness.  Lymph: No cervical or inguinal lymphadenopathy. Skin: No rashes, bruises or suspicious lesions. Neurologic: Grossly intact, no focal deficits, moving all 4 extremities. Psychiatric: Normal mood and affect.  Laboratory Data: Lab Results  Component Value Date   WBC 8.5 08/01/2018   HGB 12.2 (L) 08/01/2018   HCT 42.2 08/01/2018   MCV 75.2 (L) 08/01/2018   PLT 418 (H) 08/01/2018    Lab Results  Component Value Date   CREATININE 1.20 07/06/2021    No results found for: PSA  No results found for: TESTOSTERONE  Lab Results  Component Value Date   HGBA1C 8 12/01/2018    Urinalysis    Component Value Date/Time   COLORURINE RED (A) 10/24/2015 2034   APPEARANCEUR Clear 10/20/2023 1259   LABSPEC 1.010 10/24/2015 2034   PHURINE 8.5 (H) 10/24/2015 2034   GLUCOSEU 3+ (A) 10/20/2023 1259   HGBUR LARGE (A) 10/24/2015 2034   BILIRUBINUR Negative 10/20/2023 1259   KETONESUR NEGATIVE 10/24/2015 2034   PROTEINUR Negative 10/20/2023 1259   PROTEINUR 100 (A) 10/24/2015 2034   UROBILINOGEN 0.2 03/11/2013 1720   NITRITE Negative 10/20/2023 1259   NITRITE NEGATIVE 10/24/2015 2034   LEUKOCYTESUR Negative 10/20/2023 1259    Lab Results  Component Value Date   LABMICR Comment 10/20/2023   WBCUA 0-5 09/22/2022   LABEPIT 0-10 09/22/2022   MUCUS Present (A) 06/09/2021   BACTERIA None seen 09/22/2022    Pertinent Imaging: Renal 05/11/2024: Images reviewed and discussed with the patient Results for orders placed during the hospital encounter of 10/20/23  DG Abd 1 View  Narrative CLINICAL DATA:  Status post lithotripsy.  EXAM: ABDOMEN - 1 VIEW  COMPARISON:  Abdominal radiograph 10/04/2023  FINDINGS: Redemonstrated 10 mm stone inferior pole left kidney. Redemonstrated 5 mm round calcific density  adjacent to the left aspect of the L2 vertebral body, which represents muscular calcification on CT. No definite right nephrolithiasis. Stool throughout the colon. Nonobstructed bowel-gas pattern.  IMPRESSION: Redemonstrated 10 mm stone inferior pole left kidney.   Electronically Signed By: Bard Moats M.D. On: 10/29/2023 15:35  No results found for this or any previous visit.  No results found for this or any previous visit.  No results found for this or any previous visit.  Results for orders placed during the hospital encounter of 05/11/24  US  RENAL  Narrative CLINICAL DATA:  Kidney stones/cyst 1 year follow-up.  EXAM: RENAL / URINARY TRACT ULTRASOUND COMPLETE  COMPARISON:  CT renal stone protocol 10/28/2023  FINDINGS: Right Kidney:  Renal measurements: 10.6 cm x 6.3 cm x 5.0 cm = volume: 174 mL. Echogenicity within normal limits. No mass or hydronephrosis visualized.  Left Kidney:  Renal measurements: 11.4 cm x 6.4 cm x 6.3 cm = volume: 245 mL. Echogenicity within normal limits. No hydronephrosis visualized. Small renal calculus measuring 6.9 mm which is nonobstructing. 4.6 cm x 3.9 cm x 3.8 cm simple cyst.  Bladder:  Appears normal for degree of bladder distention. Prevoid bladder volume calculated at 134. Postvoid residual 0.  Other:  None.  IMPRESSION: Redemonstration of nonobstructed with left-sided renal stone. No hydronephrosis. Redemonstration of a left-sided renal cyst with no recommendation for follow-up.   Electronically Signed By: Cordella Banner On: 05/13/2024 14:39  No results found for this or any previous visit.  Results for orders placed during the hospital encounter of 07/06/21  CT HEMATURIA WORKUP  Narrative CLINICAL DATA:  Painless gross hematuria.  EXAM: CT ABDOMEN AND PELVIS WITHOUT AND WITH CONTRAST  TECHNIQUE: Multidetector CT imaging of the abdomen and pelvis was performed following the standard protocol before  and following the bolus administration of intravenous contrast.  CONTRAST:  OMNIPAQUE  IOHEXOL  300 MG/ML  SOLN  COMPARISON:  10/24/2015  FINDINGS: Lower chest: No acute findings.  Hepatobiliary: No mass visualized on this unenhanced exam. Gallbladder is unremarkable. No evidence of biliary ductal dilatation.  Pancreas: No mass or inflammatory process visualized on this unenhanced exam.  Spleen:  Within normal limits in size.  Adrenals/Urinary tract: No adrenal masses identified. A few tiny less than 5 mm renal calculi are seen bilaterally. No evidence of ureteral calculi or hydronephrosis. A 3.5 cm simple Bosniak category 1 cyst is seen in the upper pole of the left kidney. No complex cystic or solid renal masses identified. No masses seen involving the collecting systems, ureters, or bladder.  Stomach/Bowel: No evidence of obstruction, inflammatory process, or abnormal fluid collections. Normal appendix visualized.  Vascular/Lymphatic: No pathologically enlarged lymph nodes identified. No evidence of abdominal aortic aneurysm. Aortic atherosclerotic calcification noted.  Reproductive:  No mass or other significant abnormality.  Other:  None.  Musculoskeletal:  No suspicious bone lesions identified.  IMPRESSION: Tiny bilateral renal calculi. No evidence of ureteral calculi, hydronephrosis, or other acute findings.  No radiographic evidence of urinary tract neoplasm.   Electronically Signed By: Norleen DELENA Kil M.D. On: 07/07/2021 08:28  Results for orders placed during the hospital encounter of 10/28/23  CT RENAL STONE STUDY  Narrative CLINICAL DATA:  Left-sided flank pain  EXAM: CT ABDOMEN AND PELVIS WITHOUT CONTRAST  TECHNIQUE: Multidetector CT imaging of the abdomen and pelvis was performed following the standard protocol without IV contrast.  RADIATION DOSE REDUCTION: This exam was performed according to the departmental dose-optimization program  which includes automated exposure control, adjustment of the mA and/or kV according to patient size and/or use of iterative reconstruction technique.  COMPARISON:  CT abdomen and pelvis 07/06/2021.  FINDINGS: Lower chest: No acute abnormality. There is a calcified granuloma in the left lower lobe.  Hepatobiliary: No focal liver abnormality is seen. No gallstones, gallbladder wall thickening, or biliary dilatation.  Pancreas: No pancreatic ductal dilatation or surrounding inflammatory changes. There are calcifications in the pancreatic head likely related to chronic pancreatitis.  Spleen: Normal in size without focal abnormality.  Adrenals/Urinary Tract: There are calculi in the inferior pole the left kidney measuring up to 8 mm. There is a cyst in the superior pole the left kidney measuring 3.3 cm. There is no hydronephrosis. Right kidney is within normal limits. Adrenal glands and bladder are within normal limits.  Stomach/Bowel: Stomach is within normal limits. Appendix appears normal. No evidence of  bowel wall thickening, distention, or inflammatory changes.  Vascular/Lymphatic: Aortic atherosclerosis. No enlarged abdominal or pelvic lymph nodes.  Reproductive: Prostate is unremarkable.  Other: No abdominal wall hernia or abnormality. No abdominopelvic ascites.  Musculoskeletal: Degenerative changes affect the spine.  IMPRESSION: 1. Nonobstructing left renal calculi. No hydronephrosis. 2. Left renal cyst. 3. Findings compatible with chronic pancreatitis. 4. Aortic atherosclerosis.   Electronically Signed By: Greig Pique M.D. On: 11/05/2023 23:17   Assessment & Plan:    1. Nephrolithiasis (Primary) -followup 6 months with [facility] - Urinalysis, Routine w reflex microscopic   No follow-ups on file.  Belvie Clara, MD  [facility] [place]

## 2024-05-22 ENCOUNTER — Encounter: Payer: Self-pay | Admitting: Urology

## 2024-05-22 NOTE — Patient Instructions (Signed)

## 2024-06-27 ENCOUNTER — Other Ambulatory Visit: Payer: Self-pay

## 2024-06-27 ENCOUNTER — Ambulatory Visit: Admitting: Surgical

## 2024-06-27 DIAGNOSIS — M25552 Pain in left hip: Secondary | ICD-10-CM

## 2024-06-27 DIAGNOSIS — M170 Bilateral primary osteoarthritis of knee: Secondary | ICD-10-CM

## 2024-06-27 DIAGNOSIS — M160 Bilateral primary osteoarthritis of hip: Secondary | ICD-10-CM | POA: Diagnosis not present

## 2024-06-27 DIAGNOSIS — G8929 Other chronic pain: Secondary | ICD-10-CM | POA: Diagnosis not present

## 2024-06-27 DIAGNOSIS — M25562 Pain in left knee: Secondary | ICD-10-CM

## 2024-06-27 DIAGNOSIS — M1712 Unilateral primary osteoarthritis, left knee: Secondary | ICD-10-CM

## 2024-07-01 ENCOUNTER — Encounter: Payer: Self-pay | Admitting: Surgical

## 2024-07-01 MED ORDER — BUPIVACAINE HCL 0.25 % IJ SOLN
5.0000 mL | INTRAMUSCULAR | Status: AC | PRN
  Administered 2024-06-27: 5 mL via INTRA_ARTICULAR

## 2024-07-01 MED ORDER — LIDOCAINE HCL 1 % IJ SOLN
5.0000 mL | INTRAMUSCULAR | Status: AC | PRN
  Administered 2024-06-27: 5 mL

## 2024-07-01 NOTE — Progress Notes (Signed)
 Office Visit Note   Patient: Xavier Williams           Date of Birth: [birth year]           MRN: [id number] Visit Date: 06/27/2024 Requested by: Jerome Heron Ruth, PA-C [address] [place], [place],  [place] [address] PCP: Jerome Heron Ruth DEVONNA  Subjective: Chief Complaint  Patient presents with   bilateral knee pain    HPI: Xavier Williams is a 62 y.o. male who presents to the office reporting bilateral knee pain.  Patient has history of bilateral knee arthritis.  Left knee bothers him more than his right knee.  Describes pain that has been present and steadily worsening over the last 7 to 8 years.  He has mechanical symptoms in both knees.  Has had prior injections without any relief.  No history of prior surgery to either knee.  Has history of fatty liver disease and diabetes but states that his A1c is controlled.  His PCP is Heron Jerome at [facility].  Emily chronic hydrocodone  10-325 mg and receives 120 tablets every 30 days.  Lives at home with his wife and granddaughter.  Has increased pain after walking for long period of time.  Has told he has bilateral hip arthritis as well and has occasional groin pain but the majority of his pain is in his knees.  Does not really have any anterior thigh pain typically.  He also describes chronic back pain.  Currently on disability due to chronic back pain.  He has tingling sensation in both lower legs that come on with leg pain when he walks long distances.  This responds well to rest for brief periods of time and then he is able to keep walking after this pain subsides.              ROS: All systems reviewed are negative as they relate to the chief complaint within the history of present illness.  Patient denies fevers or chills.  Assessment & Plan: Visit Diagnoses:  1. Bilateral chronic knee pain   2. Pain in left hip     Plan: Impression is 63 year old male who is here today for bilateral knee pain.  Has severe end-stage bilateral knee  arthritis.  He is interested in knee replacements since prior cortisone and gel injections have provided no relief for him.  He states he has been told he has bilateral hip arthritis but radiographs taken today demonstrate no significant hip arthritis however.  He does report symptoms consistent with neurogenic claudication which may be playing a role in his lower leg pain.  He really has no reproducible knee pain on exam.  Last MRI of the lumbar spine was performed in 2020 and demonstrated broad-based disc bulge at L4-L5 and L5-S1 with foraminal stenosis at both levels but without central canal stenosis at any level.  This may be different now given the neurogenic claudication symptoms he has.  After discussion of options, we will plan to try lidocaine /Marcaine  injection into the most symptomatic knee today.  Left knee injected and he will let us  know how he feels as he becomes active this afternoon after his clinic visit.  Recommended he send MyChart message tomorrow or in the coming days.  If he has great relief of his typical leg pain, this is likely stemming from his knee arthritis.  If no significant improvement or pain feels roughly the same, would recommend getting new MRI of the lumbar spine to evaluate for spinal  stenosis.  AP and frog lateral view of left hip reviewed.  No fracture or dislocation.  No significant degenerative changes of either hip joint.  AP, lateral, sunrise views of bilateral knees demonstrate severe end-stage degenerative changes present in both knees worse in the medial and patellofemoral compartments.  Right knees degenerative changes are slightly worse than the left.  No fracture or dislocation.  Varus alignment noted bilaterally.  Follow-Up Instructions: No follow-ups on file.   Orders:  Orders Placed This Encounter  Procedures   DG Knee AP/LAT W/Sunrise Right   DG Knee AP/LAT W/Sunrise Left   DG HIP UNILAT WITH PELVIS 2-3 VIEWS LEFT   No orders of the defined types  were placed in this encounter.     Procedures: Large Joint Inj: L knee on 06/27/2024 12:01 PM Indications: diagnostic evaluation, joint swelling and pain Details: 18 G 1.5 in needle, superolateral approach  Arthrogram: No  Medications: 5 mL lidocaine  1 %; 5 mL bupivacaine  0.25 % Outcome: tolerated well, no immediate complications Procedure, treatment alternatives, risks and benefits explained, specific risks discussed. Consent was given by the patient. Immediately prior to procedure a time out was called to verify the correct patient, procedure, equipment, support staff and site/side marked as required. Patient was prepped and draped in the usual sterile fashion.       Clinical Data: No additional findings.  Objective: Vital Signs: There were no vitals taken for this visit.  Physical Exam:  Constitutional: Patient appears well-developed HEENT:  Head: Normocephalic Eyes:EOM are normal Neck: Normal range of motion Cardiovascular: Normal rate Pulmonary/chest: Effort normal Neurologic: Patient is alert Skin: Skin is warm Psychiatric: Patient has normal mood and affect  Ortho Exam: Ortho exam demonstrates right knee with 5 degrees extension and 120 degrees of knee flexion.  Varus alignment is present.  Left knee with 10 degrees extension and 120 degrees knee flexion.  Varus alignment is present in this knee as well.  Neither of these varus deformities are correctable.  Stable to anterior and posterior drawer sign bilaterally.  No pain with hip range of motion bilaterally.  Intact ankle dorsiflexion, plantarflexion, and quadricep/hamstring/hip flexion strength rated 5/5.  Not any significant joint line tenderness over the medial or lateral joint lines bilaterally.  Specialty Comments:  No specialty comments available.  Imaging: No results found.   PMFS History: Patient Active Problem List   Diagnosis Date Noted   Nephrolithiasis 05/19/2021   Uncontrolled type 2 diabetes  mellitus with hyperglycemia (HCC) 12/26/2018   Essential hypertension, benign 12/26/2018   Mixed hyperlipidemia 04/22/2009   Overweight 04/22/2009   Past Medical History:  Diagnosis Date   Chronic back pain    DDD (degenerative disc disease), lumbar    Depression    Diabetes mellitus    Hyperlipidemia    Hypertension    Kidney stones    Schizophrenia (HCC)    Sleep apnea     Family History  Problem Relation Age of Onset   Heart attack Mother     Past Surgical History:  Procedure Laterality Date   CARDIAC CATHETERIZATION  2005   normal coronary arteries   COLONOSCOPY     EXTRACORPOREAL SHOCK WAVE LITHOTRIPSY Left 10/04/2023   Procedure: LITHOTRIPSY, ESWL;  Surgeon: Sherrilee Belvie CROME, MD;  Location: [facility];  Service: Urology;  Laterality: Left;   Social History   Occupational History   Not on file  Tobacco Use   Smoking status: Former    Current packs/day: 1.00    Average packs/day:  1 pack/day for 50.9 years (50.9 ttl pk-yrs)    Types: Cigarettes    Start date: 07/26/1973   Smokeless tobacco: Former    Types: Engineer, Drilling   Vaping status: Never Used  Substance and Sexual Activity   Alcohol  use: No   Drug use: No   Sexual activity: Not on file

## 2024-11-14 ENCOUNTER — Ambulatory Visit: Admitting: Urology
